# Patient Record
Sex: Female | Born: 1985 | Race: Black or African American | Hispanic: No | Marital: Single | Smoking: Never smoker
Health system: Southern US, Community
[De-identification: ages and names within clinical notes are randomized; demographics above are authoritative.]

## PROBLEM LIST (undated history)

## (undated) ENCOUNTER — Inpatient Hospital Stay (HOSPITAL_COMMUNITY): Payer: Self-pay

## (undated) DIAGNOSIS — K469 Unspecified abdominal hernia without obstruction or gangrene: Secondary | ICD-10-CM

## (undated) DIAGNOSIS — O139 Gestational [pregnancy-induced] hypertension without significant proteinuria, unspecified trimester: Secondary | ICD-10-CM

## (undated) DIAGNOSIS — F419 Anxiety disorder, unspecified: Secondary | ICD-10-CM

## (undated) HISTORY — PX: NO PAST SURGERIES: SHX2092

---
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
## 2006-01-30 ENCOUNTER — Inpatient Hospital Stay (HOSPITAL_COMMUNITY): Admission: AD | Admit: 2006-01-30 | Discharge: 2006-01-30 | Payer: Self-pay | Admitting: Obstetrics and Gynecology

## 2006-02-12 ENCOUNTER — Ambulatory Visit (HOSPITAL_COMMUNITY): Admission: RE | Admit: 2006-02-12 | Discharge: 2006-02-12 | Payer: Self-pay | Admitting: Obstetrics

## 2006-04-06 ENCOUNTER — Inpatient Hospital Stay (HOSPITAL_COMMUNITY): Admission: AD | Admit: 2006-04-06 | Discharge: 2006-04-06 | Payer: Self-pay | Admitting: Obstetrics

## 2006-04-08 ENCOUNTER — Ambulatory Visit (HOSPITAL_COMMUNITY): Admission: RE | Admit: 2006-04-08 | Discharge: 2006-04-08 | Payer: Self-pay | Admitting: Obstetrics

## 2006-04-18 ENCOUNTER — Inpatient Hospital Stay (HOSPITAL_COMMUNITY): Admission: AD | Admit: 2006-04-18 | Discharge: 2006-04-18 | Payer: Self-pay | Admitting: Obstetrics

## 2006-04-30 ENCOUNTER — Ambulatory Visit (HOSPITAL_COMMUNITY): Admission: RE | Admit: 2006-04-30 | Discharge: 2006-04-30 | Payer: Self-pay | Admitting: Obstetrics

## 2006-05-20 ENCOUNTER — Inpatient Hospital Stay (HOSPITAL_COMMUNITY): Admission: AD | Admit: 2006-05-20 | Discharge: 2006-05-20 | Payer: Self-pay | Admitting: Obstetrics & Gynecology

## 2006-06-18 ENCOUNTER — Inpatient Hospital Stay (HOSPITAL_COMMUNITY): Admission: AD | Admit: 2006-06-18 | Discharge: 2006-06-18 | Payer: Self-pay | Admitting: Obstetrics

## 2006-06-30 ENCOUNTER — Inpatient Hospital Stay (HOSPITAL_COMMUNITY): Admission: AD | Admit: 2006-06-30 | Discharge: 2006-07-02 | Payer: Self-pay | Admitting: Obstetrics

## 2006-07-05 ENCOUNTER — Inpatient Hospital Stay (HOSPITAL_COMMUNITY): Admission: AD | Admit: 2006-07-05 | Discharge: 2006-07-05 | Payer: Self-pay | Admitting: Obstetrics

## 2006-07-13 ENCOUNTER — Inpatient Hospital Stay (HOSPITAL_COMMUNITY): Admission: AD | Admit: 2006-07-13 | Discharge: 2006-07-17 | Payer: Self-pay | Admitting: Obstetrics & Gynecology

## 2006-12-31 ENCOUNTER — Emergency Department (HOSPITAL_COMMUNITY): Admission: EM | Admit: 2006-12-31 | Discharge: 2007-01-01 | Payer: Self-pay | Admitting: Emergency Medicine

## 2007-06-23 ENCOUNTER — Telehealth (INDEPENDENT_AMBULATORY_CARE_PROVIDER_SITE_OTHER): Payer: Self-pay | Admitting: *Deleted

## 2007-06-26 DIAGNOSIS — L708 Other acne: Secondary | ICD-10-CM | POA: Insufficient documentation

## 2007-06-29 ENCOUNTER — Ambulatory Visit: Payer: Self-pay | Admitting: Pulmonary Disease

## 2007-06-29 DIAGNOSIS — G43909 Migraine, unspecified, not intractable, without status migrainosus: Secondary | ICD-10-CM | POA: Insufficient documentation

## 2007-06-29 DIAGNOSIS — I10 Essential (primary) hypertension: Secondary | ICD-10-CM

## 2007-06-29 DIAGNOSIS — N390 Urinary tract infection, site not specified: Secondary | ICD-10-CM

## 2007-07-01 LAB — CONVERTED CEMR LAB
ALT: 19 units/L (ref 0–35)
Albumin: 3.8 g/dL (ref 3.5–5.2)
Alkaline Phosphatase: 83 units/L (ref 39–117)
BUN: 6 mg/dL (ref 6–23)
Basophils Absolute: 0 10*3/uL (ref 0.0–0.1)
Basophils Relative: 0.4 % (ref 0.0–1.0)
CO2: 28 meq/L (ref 19–32)
Calcium: 9.5 mg/dL (ref 8.4–10.5)
Chloride: 106 meq/L (ref 96–112)
Creatinine, Ser: 0.5 mg/dL (ref 0.4–1.2)
MCHC: 34.3 g/dL (ref 30.0–36.0)
Monocytes Absolute: 0.3 10*3/uL (ref 0.2–0.7)
Monocytes Relative: 3.4 % (ref 3.0–11.0)
Platelets: 270 10*3/uL (ref 150–400)
Potassium: 4 meq/L (ref 3.5–5.1)
RBC: 4.89 M/uL (ref 3.87–5.11)
RDW: 12.9 % (ref 11.5–14.6)
Total Bilirubin: 0.5 mg/dL (ref 0.3–1.2)
Total Protein: 7 g/dL (ref 6.0–8.3)

## 2007-11-27 ENCOUNTER — Ambulatory Visit: Payer: Self-pay | Admitting: Internal Medicine

## 2008-03-09 ENCOUNTER — Emergency Department (HOSPITAL_COMMUNITY): Admission: EM | Admit: 2008-03-09 | Discharge: 2008-03-09 | Payer: Self-pay | Admitting: Emergency Medicine

## 2008-03-23 ENCOUNTER — Ambulatory Visit: Payer: Self-pay | Admitting: Pulmonary Disease

## 2008-03-23 DIAGNOSIS — R519 Headache, unspecified: Secondary | ICD-10-CM | POA: Insufficient documentation

## 2008-03-23 DIAGNOSIS — R51 Headache: Secondary | ICD-10-CM

## 2008-03-28 DIAGNOSIS — R109 Unspecified abdominal pain: Secondary | ICD-10-CM | POA: Insufficient documentation

## 2008-04-15 IMAGING — US US OB FOLLOW-UP
1 series · 13 of 28 positions shown · non-contrast
Comparison: none

CLINICAL DATA: Evaluate growth; size greater than dates; EGA by LMP is 26 weeks 1 day.

[Series 1: us ob follow-up · 0.37mm/px · 13 of 64 slices shown]
[im 3/64]
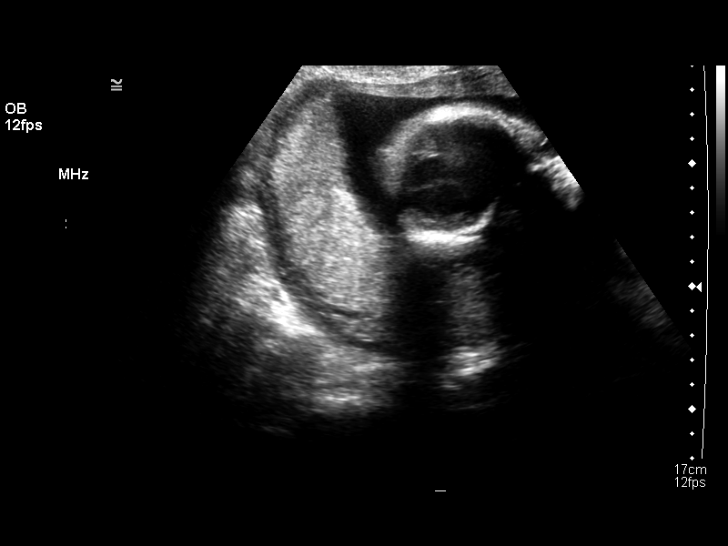
[im 8/64]
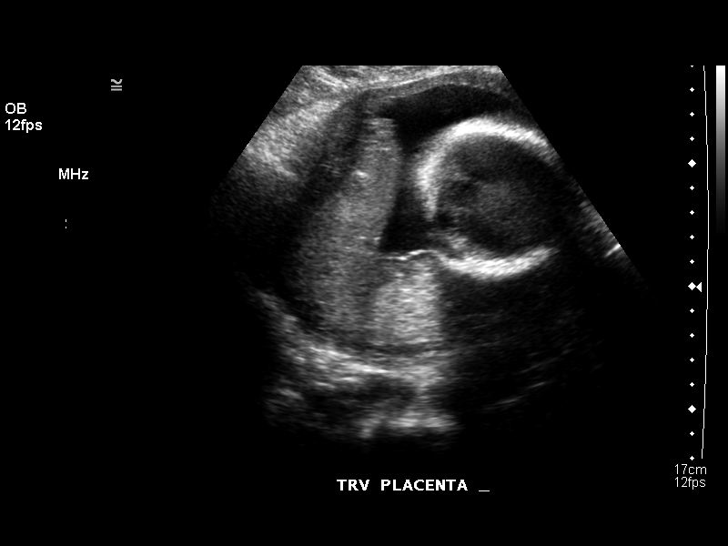
[im 12/64]
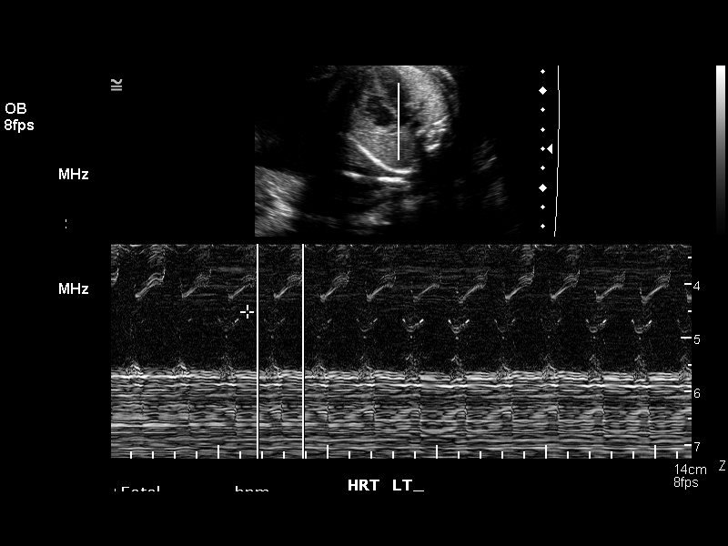
[im 17/64]
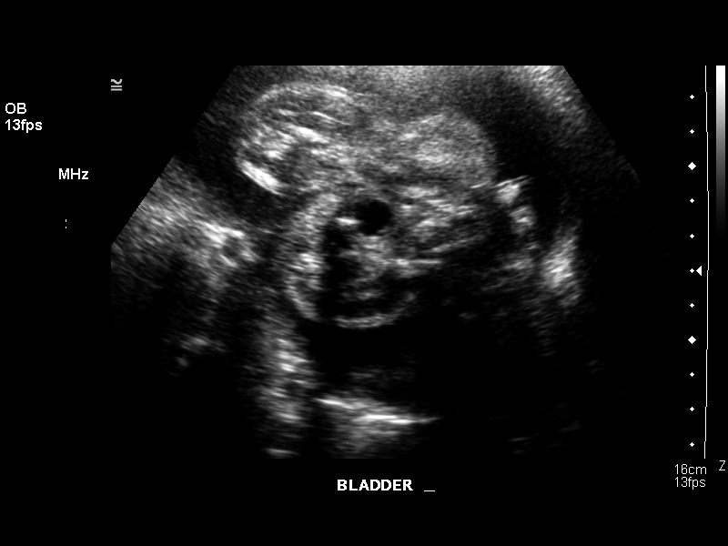
[im 22/64]
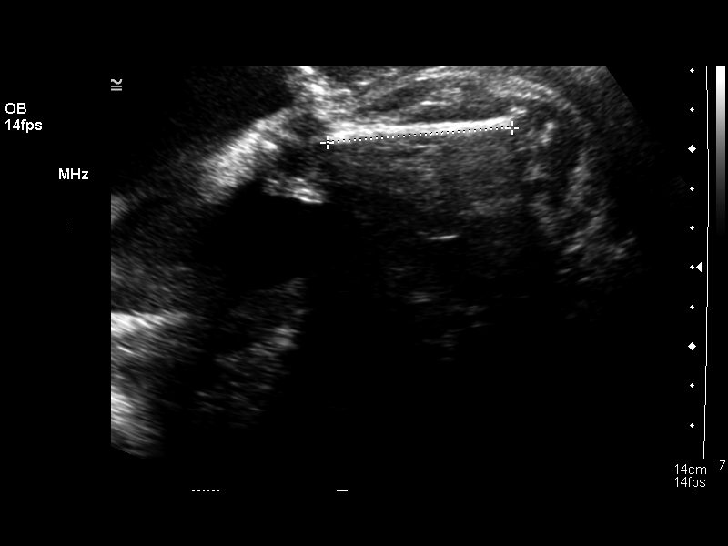
[im 26/64]
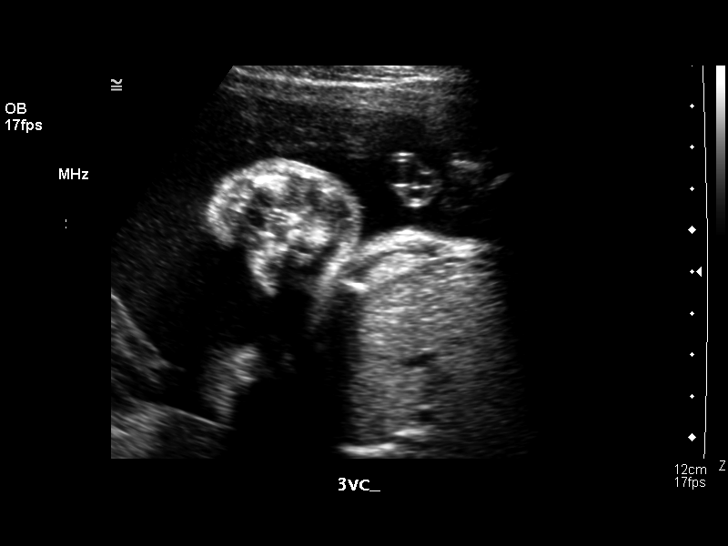
[im 33/64]
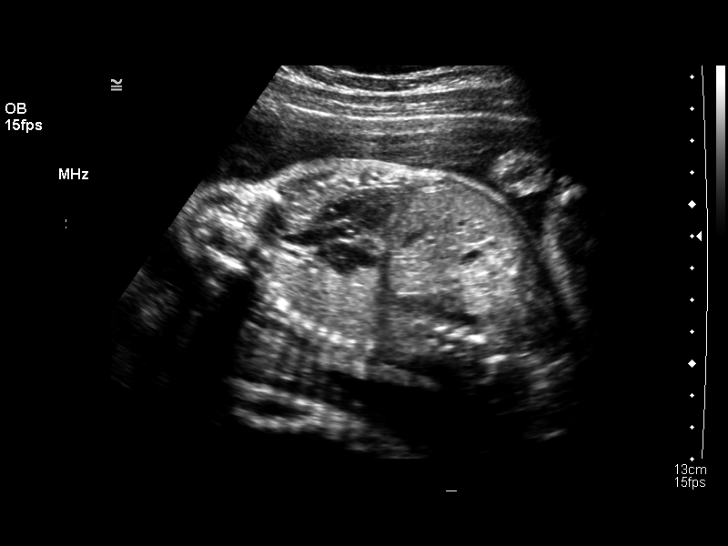
[im 38/64]
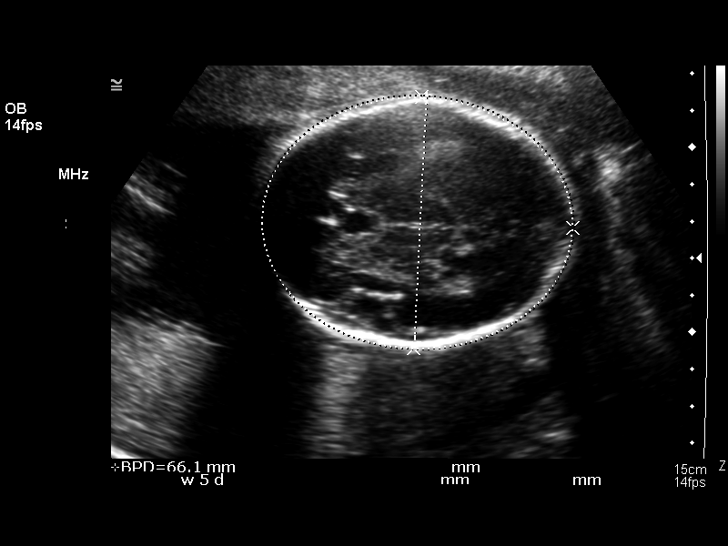
[im 43/64]
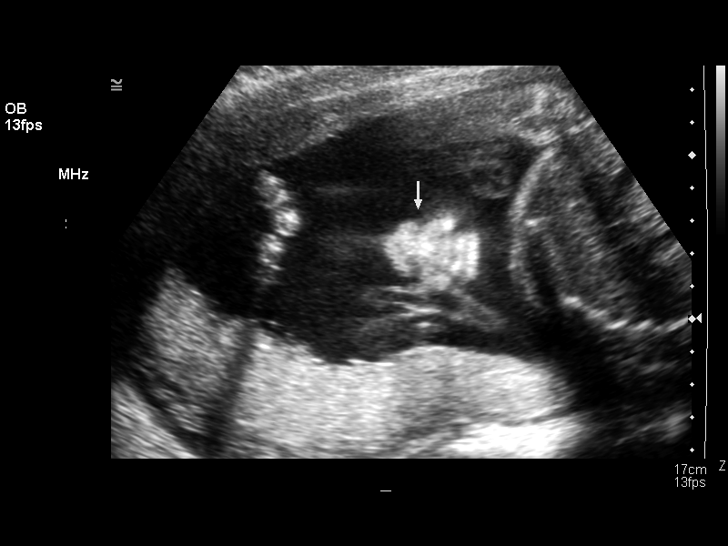
[im 47/64]
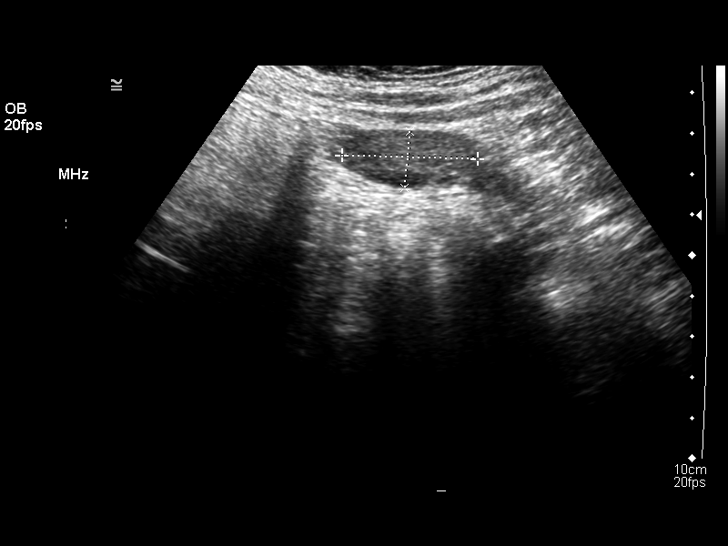
[im 52/64]
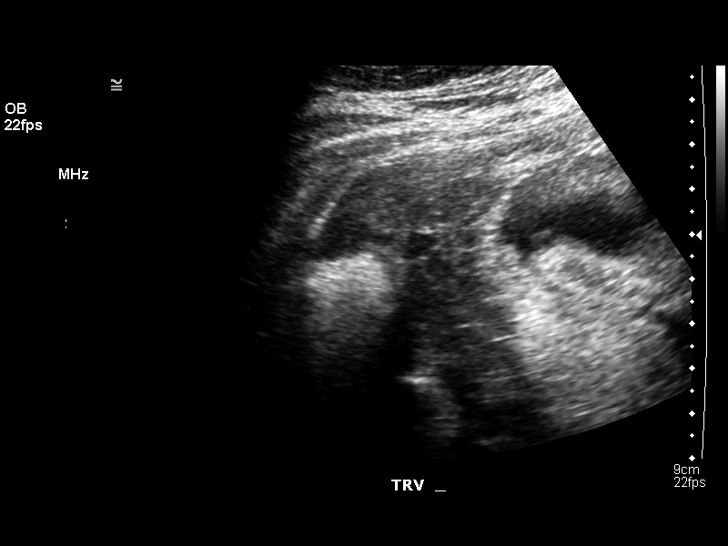
[im 57/64]
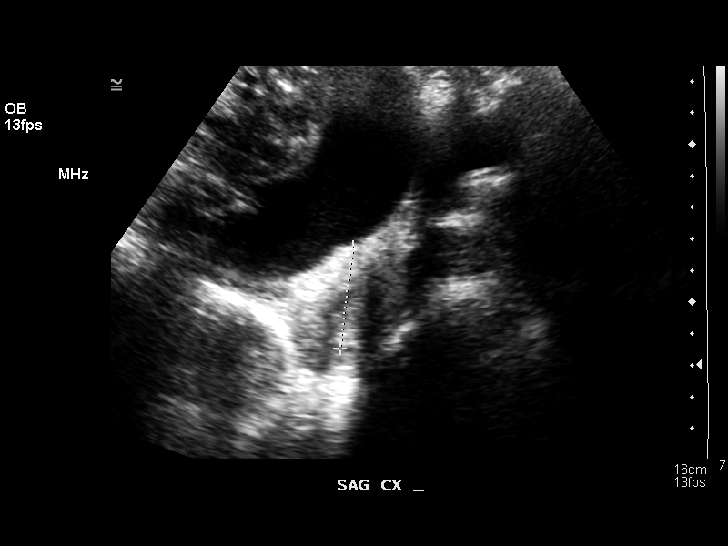
[im 61/64]
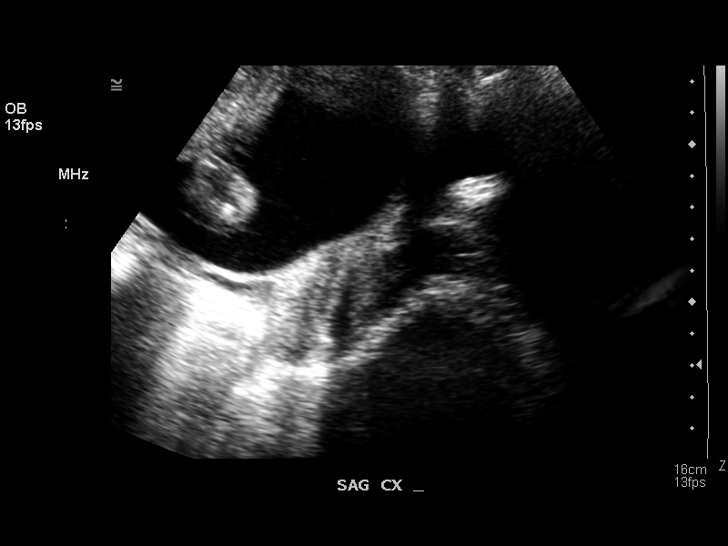

[13 of 28 positions shown; findings below may reference images not displayed]

OBSTETRICAL ULTRASOUND RE-EVALUATION:
 Number of Fetuses:  1
 Heart Rate:  144 bpm
 Movement:  Yes
 Breathing:  No
 Presentation:  Breech
 Placental Location:  Fundal, posterior
 Grade:  I
 Previa:  No
 Amniotic Fluid (subjective):  Normal
 Amniotic Fluid (objective):  6.0 cm vertical pocket 

 FETAL BIOMETRY
 BPD:  6.6 cm     26 w 5 d 
 HC:  24.3 cm   26 w 3 d 
 AC:  23.3 cm  27 w 4 d 
 FL:  4.8 cm   26 w 1 d 

 Mean GA:  26 w 5 d   US EDC:  07/10/06
 Assigned GA:  26 w 1 d   Assigned EDC:  07/14/06

 EFW:  8225 grams 75th ? 90th %ile (885 ? 0506 g) for 26 weeks 

 FETAL ANATOMY
 Lateral Ventricles:  Visualized 
 Thalami/CSP:  Visualized 
 Posterior Fossa:  Visualized 
 Nuchal Region:  Visualized 
 Spine:  Previously visualized 
 4 Chamber Heart on Left:  Visualized 
 Stomach on Left:  Visualized 
 3 Vessel Cord:  Visualized 
 Cord Insertion site:  Visualized 
 Kidneys:  Visualized 
 Bladder:  Visualized 
 Extremities:  Previously visualized 

 ADDITIONAL ANATOMY VISUALIZED:  Upper lip, profile, diaphragm, aortic arch.

 MATERNAL UTERINE AND ADNEXAL FINDINGS
 Cervix:  3.2 cm transabdominal.
IMPRESSION: There is a single living intrauterine gestation in breech presentation.  The mean gestational age by today?s ultrasound is 26 weeks 5 days, which is at the 30th-35th percentile for a 26 week gestation.  The fetal indices are concordant and the visualized anatomy is normal.  The amniotic fluid volume is within normal limits.

## 2008-06-14 ENCOUNTER — Emergency Department (HOSPITAL_COMMUNITY): Admission: EM | Admit: 2008-06-14 | Discharge: 2008-06-14 | Payer: Self-pay | Admitting: Emergency Medicine

## 2008-08-18 ENCOUNTER — Telehealth: Payer: Self-pay | Admitting: Adult Health

## 2008-12-28 ENCOUNTER — Emergency Department (HOSPITAL_COMMUNITY): Admission: EM | Admit: 2008-12-28 | Discharge: 2008-12-28 | Payer: Self-pay | Admitting: Emergency Medicine

## 2009-03-06 ENCOUNTER — Encounter: Payer: Self-pay | Admitting: Adult Health

## 2009-03-06 ENCOUNTER — Ambulatory Visit: Payer: Self-pay | Admitting: Internal Medicine

## 2009-03-06 DIAGNOSIS — M25569 Pain in unspecified knee: Secondary | ICD-10-CM | POA: Insufficient documentation

## 2009-03-06 DIAGNOSIS — R5381 Other malaise: Secondary | ICD-10-CM | POA: Insufficient documentation

## 2009-03-06 DIAGNOSIS — R5383 Other fatigue: Secondary | ICD-10-CM

## 2009-03-06 DIAGNOSIS — R635 Abnormal weight gain: Secondary | ICD-10-CM | POA: Insufficient documentation

## 2009-03-06 DIAGNOSIS — K5900 Constipation, unspecified: Secondary | ICD-10-CM | POA: Insufficient documentation

## 2009-03-07 ENCOUNTER — Telehealth: Payer: Self-pay | Admitting: Adult Health

## 2009-03-07 LAB — CONVERTED CEMR LAB
Albumin: 4 g/dL (ref 3.5–5.2)
BUN: 8 mg/dL (ref 6–23)
Basophils Absolute: 0 10*3/uL (ref 0.0–0.1)
CO2: 29 meq/L (ref 19–32)
Calcium: 9.5 mg/dL (ref 8.4–10.5)
Eosinophils Absolute: 0.5 10*3/uL (ref 0.0–0.7)
Glucose, Bld: 88 mg/dL (ref 70–99)
HCT: 42.7 % (ref 36.0–46.0)
Hemoglobin: 14.2 g/dL (ref 12.0–15.0)
Lymphs Abs: 2 10*3/uL (ref 0.7–4.0)
MCHC: 33.4 g/dL (ref 30.0–36.0)
Neutro Abs: 3.6 10*3/uL (ref 1.4–7.7)
Platelets: 206 10*3/uL (ref 150.0–400.0)
Potassium: 4 meq/L (ref 3.5–5.1)
RDW: 12.3 % (ref 11.5–14.6)
Rhuematoid fact SerPl-aCnc: 20 intl units/mL (ref 0.0–20.0)
Sed Rate: 5 mm/hr (ref 0–22)
Sodium: 143 meq/L (ref 135–145)
TSH: 0.36 microintl units/mL (ref 0.35–5.50)
Total Protein: 7.2 g/dL (ref 6.0–8.3)
hCG, Beta Chain, Quant, S: <0.5 milliintl units/mL

## 2009-06-27 ENCOUNTER — Emergency Department (HOSPITAL_COMMUNITY): Admission: EM | Admit: 2009-06-27 | Discharge: 2009-06-27 | Payer: Self-pay | Admitting: Emergency Medicine

## 2010-02-14 ENCOUNTER — Emergency Department (HOSPITAL_COMMUNITY): Admission: EM | Admit: 2010-02-14 | Discharge: 2010-02-14 | Payer: Self-pay | Admitting: Emergency Medicine

## 2010-02-15 ENCOUNTER — Encounter: Payer: Self-pay | Admitting: Adult Health

## 2010-02-15 ENCOUNTER — Ambulatory Visit: Payer: Self-pay | Admitting: Pulmonary Disease

## 2010-05-04 ENCOUNTER — Telehealth: Payer: Self-pay | Admitting: Pulmonary Disease

## 2010-07-06 ENCOUNTER — Ambulatory Visit: Admit: 2010-07-06 | Payer: Self-pay | Admitting: Pulmonary Disease

## 2010-07-09 ENCOUNTER — Telehealth: Payer: Self-pay | Admitting: Pulmonary Disease

## 2010-07-16 ENCOUNTER — Encounter: Payer: Self-pay | Admitting: Obstetrics

## 2010-07-24 NOTE — Progress Notes (Signed)
Summary: nos appt  Phone Note Call from Patient   Caller: [email or web address]  Call For: nadel Summary of Call: Rsc nos from 11/10 to 07/06/2010. Initial call taken by: Darletta Moll,  May 04, 2010 9:36 AM

## 2010-07-24 NOTE — Assessment & Plan Note (Signed)
Summary: Acute NP office visit - lower abd pain, constipation   CC:  lower abd pain, described as sharp from the pelvic area up to the upper abd.  nausea w/ eating, constipation, and bloating x47month..  History of Present Illness: 25  yo AAF with known history of intermittent headaches, borderline HTN.    11/27/07--Pt present today reporting over last year she has had several occasions when her  blood pressure has been elevated. 140-170 systolic. During recent pregnancy 1 year ago she had borderline HTN. She does use advil as needed for headache.   Checked blood pressure at [facility]-was elevated last week. Pt is a single mom going to school full-time and working second shift fulltime. She has 24 yr old. Very stressed, puts lots of demands on self. "feels overwhelmed"   March 06, 2009--Presents for multiple complaints today. Complains over last several weeks, legs and feet ache at night, she has to massage muscle and feet to help. Takes tylneol with no help. Currently single mom of 2 yr old son, works full time, and goes to school. Complains of frequent headaches and neck tightness, works on computer all day. Takes goody powders w/ some help. Complains of constipation, eats fruit and takes laxatavie, no bloody stool, abd pain. Complains that her weight keeps going up, despite trying to eat right. Discussed diet, she does eat late at night, drinks fruit juice, eats fast food at least 1x wk, only exercsies some when she has a chance. She has Mirena IUD. Does not think she is pregnant. Denies chest pain, dyspnea, orthopnea, hemoptysis, fever, n/v/d, edema, headache,recent travel , known injury, back pain, radicular symptoms, ext. weakness, joint swelling, rash, cough.    02/15/10--Presents for an acute office visit. Complains of lower abd pain, described as sharp from the pelvic area up to the upper abd.  nausea w/ eating, constipation, bloating x73month. She complains that she is very constipated, has  intermittent hard stools, Started laxatives this week w/ no substantial results. She presented with constipation in past w/ recs for stool softner, miralax, diet, etc. She says she tried for a while but stopped. No bloody stools, vomitting, dysphagia. Has a Mirena. Denies chest pain,  orthopnea, hemoptysis, fever,  edema, headache.  Medications Prior to Update: 1)  Mirena 20 Mcg/24hr  Iud (Levonorgestrel) .... Every 5 Years 2)  Miralax  Powd (Polyethylene Glycol 3350) .... Daily 3)  Colace 100 Mg Caps (Docusate Sodium) .Marland Kitchen.. 1 By Mouth At Bedtime 4)  Midrin 325-65-100 Mg  Caps (Apap-Isometheptene-Dichloral) .Marland Kitchen.. 1 By Mouth Every 4 Hr As Needed Headache  Current Medications (verified): 1)  Mirena 20 Mcg/24hr  Iud (Levonorgestrel) .... Every 5 Years 2)  Miralax  Powd (Polyethylene Glycol 3350) .... Daily 3)  Colace 100 Mg Caps (Docusate Sodium) .Marland Kitchen.. 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   Risk Factors: Smoking Status: quit (06/29/2007)  Review of Systems      See HPI  Vital Signs:  Patient profile:   25 year old female Height:      63 inches Weight:      171.13 pounds BMI:  30.42 O2 Sat:      99 % on Room air Temp:     97.8 degrees F oral Pulse rate:   88 / minute BP sitting:   136 / 88  (right arm) Cuff size:   regular  Vitals Entered By: Boone Master CNA/MA (February 15, 2010 9:40 AM)  O2 Flow:  Room air CC: lower abd pain, described as sharp from the pelvic area up to the upper abd.  nausea w/ eating, constipation, bloating  x17month. Is Patient Diabetic? No Comments Medications reviewed with patient Daytime contact number verified with patient. Boone Master CNA/MA  February 15, 2010 9:41 AM    Physical Exam  Additional Exam:  GENERAL:  A/Ox3; pleasant & cooperative.NAD HEENT:  [place]/AT, EOM-wnl, PERRLA, EACs-clear, TMs-wnl, NOSE-clear, THROAT-clear & wnl. NECK:  Supple w/ fair ROM; no JVD; normal carotid impulses w/o bruits; no thyromegaly or nodules palpated; no lymphadenopathy. CHEST:  Clear to P & A; w/o, wheezes/ rales/ or rhonchi. HEART:  RRR, no m/r/g  heard ABDOMEN:  Soft & nt; nml bowel sounds; no organomegaly or masses detected, no gurading or rebound.  EXT: Warm bilat,  no calf pain, edema, clubbing, pulses intact,  no edema, warm, intact pulses bilaterally, joint w/o swelling, nml rom,  Skin: no rash/lesion    Impression & Recommendations:  Problem # 1:  CONSTIPATION (ICD-564.00)  Suspect symptoms are possible IBS /constipation  Plan:  Increase water intake.  May use Miralax at bedtime , if still constipated increase to two times a day  GAS X WITH ALL MEALS  High fiber diet. --lots of fruits vegetables, prunes, yogurts-Activa May try Align once daily --probiotic x 2 weeks, if unable to afford may use generic acidophllius once daily  2 Stool softners  at bedtime  If not improving, call back for sooner follow up  Please contact office for sooner follow up if symptoms do not improve or worsen  follow up Dr. Kriste Basque in 3 mo ths for physical if not improving will need further evaluation and tx options.  Her updated medication list for this problem includes:    Miralax Powd (Polyethylene glycol 3350) .Marland Kitchen... Daily    Colace 100 Mg Caps (Docusate sodium) .Marland Kitchen... 1 by mouth at bedtime  Orders: Est. Patient Level III (16109)  Complete Medication List: 1)  Mirena 20 Mcg/24hr Iud (Levonorgestrel) .... Every 5 years 2)  Miralax Powd (Polyethylene glycol 3350) .... Daily 3)  Colace 100 Mg Caps (Docusate  sodium) .Marland Kitchen.. 1 by mouth at bedtime   Patient Instructions: 1)  Increase water intake.  2)  May use Miralax at bedtime , if still constipated increase to two times a day  3)  GAS X WITH ALL MEALS  4)  High fiber diet. --lots of fruits vegetables, prunes, yogurts-Activa 5)  May try Align once daily --probiotic x 2 weeks, if unable to afford may use generic acidophllius once daily  6)  2 Stool softners  at bedtime  7)  If not improving, call back for sooner follow up  8)  Please contact office for sooner follow up if symptoms do not improve or worsen  9)  follow up Dr. Kriste Basque in 3 mo ths for physical   Immunization History:  Influenza Immunization History:    Influenza:  historical (04/24/2009)

## 2010-07-24 NOTE — Letter (Signed)
Summary: [employer]  [address]   [place], [place] [address]   Phone: [phone]  Fax: [phone]    Today's Date: February 15, 2010  Name of Patient: [name]  The above named patient had a medical visit today at:  9:30am.  Please take this into consideration when reviewing the time away from work.  Special Instructions:  [  ] None  [ X ] To be off the remainder of today, returning to the normal work schedule tomorrow.  [  ] To be off until the next scheduled appointment on ______________________.  [  ] Other ________________________________________________________________ ________________________________________________________________________   Sincerely yours,       Rubye Oaks, NP

## 2010-07-26 ENCOUNTER — Inpatient Hospital Stay (HOSPITAL_COMMUNITY)
Admission: AD | Admit: 2010-07-26 | Discharge: 2010-07-26 | Disposition: A | Discharge status: Home / Self Care | Payer: Self-pay | Source: Ambulatory Visit | Attending: Obstetrics | Admitting: Obstetrics

## 2010-07-26 ENCOUNTER — Inpatient Hospital Stay (HOSPITAL_COMMUNITY): Payer: Self-pay

## 2010-07-26 DIAGNOSIS — N949 Unspecified condition associated with female genital organs and menstrual cycle: Secondary | ICD-10-CM

## 2010-07-26 DIAGNOSIS — N938 Other specified abnormal uterine and vaginal bleeding: Secondary | ICD-10-CM | POA: Insufficient documentation

## 2010-07-26 LAB — HEMOGLOBIN AND HEMATOCRIT, BLOOD
HCT: 38.5 % (ref 36.0–46.0)
Hemoglobin: 13.3 g/dL (ref 12.0–15.0)

## 2010-07-26 LAB — URINE MICROSCOPIC-ADD ON

## 2010-07-26 LAB — URINALYSIS, ROUTINE W REFLEX MICROSCOPIC
Leukocytes, UA: NEGATIVE
Nitrite: NEGATIVE
Protein, ur: NEGATIVE mg/dL
Urobilinogen, UA: 0.2 mg/dL (ref 0.0–1.0)

## 2010-07-26 LAB — WET PREP, GENITAL

## 2010-07-26 NOTE — Progress Notes (Signed)
Summary: nos appt  Phone Note Call from Patient   Caller: [email or web address]  Call For: Taralynn Quiett Summary of Call: Rsc nos from 1/13 to 2/29. Initial call taken by: Darletta Moll,  July 09, 2010 9:25 AM

## 2010-07-27 LAB — GC/CHLAMYDIA PROBE AMP, GENITAL: Chlamydia, DNA Probe: NEGATIVE

## 2010-08-02 ENCOUNTER — Ambulatory Visit: Payer: Self-pay | Admitting: Adult Health

## 2010-08-02 ENCOUNTER — Emergency Department (HOSPITAL_COMMUNITY): Payer: Self-pay

## 2010-08-02 ENCOUNTER — Emergency Department (HOSPITAL_COMMUNITY)
Admission: EM | Admit: 2010-08-02 | Discharge: 2010-08-02 | Disposition: A | Discharge status: Home / Self Care | Payer: Self-pay | Attending: Emergency Medicine | Admitting: Emergency Medicine

## 2010-08-02 ENCOUNTER — Telehealth: Payer: Self-pay | Admitting: Pulmonary Disease

## 2010-08-02 DIAGNOSIS — IMO0001 Reserved for inherently not codable concepts without codable children: Secondary | ICD-10-CM | POA: Insufficient documentation

## 2010-08-02 DIAGNOSIS — N390 Urinary tract infection, site not specified: Secondary | ICD-10-CM | POA: Insufficient documentation

## 2010-08-02 LAB — URINALYSIS, ROUTINE W REFLEX MICROSCOPIC
Ketones, ur: 40 mg/dL — AB
Nitrite: NEGATIVE
Protein, ur: 30 mg/dL — AB
pH: 5.5 (ref 5.0–8.0)

## 2010-08-02 LAB — URINE MICROSCOPIC-ADD ON

## 2010-08-04 LAB — URINE CULTURE

## 2010-08-09 NOTE — Progress Notes (Signed)
Summary: flu sx  Phone Note Call from Patient   Caller: Patient Call For: [name] Summary of Call: pt c/o body aches/ pain w/ nausea x 1 day. has fever but doesn't know temp[facility] [address] (pt last seen in 09). pt # [id number] Initial call taken by: Tivis Ringer, CNA,  August 02, 2010 2:50 PM  Follow-up for Phone Call        Spoke with the pt and she is c/o body aches, chills, headaches, nausea x 1 day.  offerted pt and appt tomorrow but she states she cannot get off work and that she will just go to urgent care tonight. Carron Curie CMA  August 02, 2010 3:44 PM

## 2010-08-10 ENCOUNTER — Inpatient Hospital Stay (HOSPITAL_COMMUNITY)
Admission: AD | Admit: 2010-08-10 | Discharge: 2010-08-10 | Discharge status: Against Medical Advice | Payer: Self-pay | Source: Ambulatory Visit | Attending: Obstetrics | Admitting: Obstetrics

## 2010-08-10 DIAGNOSIS — N949 Unspecified condition associated with female genital organs and menstrual cycle: Secondary | ICD-10-CM | POA: Insufficient documentation

## 2010-08-10 DIAGNOSIS — R109 Unspecified abdominal pain: Secondary | ICD-10-CM | POA: Insufficient documentation

## 2010-08-10 DIAGNOSIS — K5900 Constipation, unspecified: Secondary | ICD-10-CM | POA: Insufficient documentation

## 2010-08-22 ENCOUNTER — Encounter: Payer: Self-pay | Admitting: Pulmonary Disease

## 2010-08-23 ENCOUNTER — Telehealth: Payer: Self-pay | Admitting: Pulmonary Disease

## 2010-08-30 NOTE — Progress Notes (Signed)
Summary: nos appt  Phone Note Call from Patient   Caller: [email or web address]  Call For: nadel Summary of Call: LMTCB x2 to rsc nos from 32/29. Initial call taken by: Darletta Moll,  August 23, 2010 3:11 PM

## 2010-09-06 LAB — URINALYSIS, ROUTINE W REFLEX MICROSCOPIC
Hgb urine dipstick: NEGATIVE
Nitrite: NEGATIVE
Specific Gravity, Urine: 1.012 (ref 1.005–1.030)
Urobilinogen, UA: 0.2 mg/dL (ref 0.0–1.0)

## 2010-09-06 LAB — POCT PREGNANCY, URINE: Preg Test, Ur: NEGATIVE

## 2010-09-30 LAB — CBC
Platelets: 183 10*3/uL (ref 150–400)
WBC: 11.4 10*3/uL — ABNORMAL HIGH (ref 4.0–10.5)

## 2010-09-30 LAB — COMPREHENSIVE METABOLIC PANEL
AST: 19 U/L (ref 0–37)
Albumin: 3.9 g/dL (ref 3.5–5.2)
Alkaline Phosphatase: 83 U/L (ref 39–117)
Chloride: 107 mEq/L (ref 96–112)
GFR calc Af Amer: >60 mL/min (ref >60)
Potassium: 3.7 mEq/L (ref 3.5–5.1)
Total Bilirubin: 0.3 mg/dL (ref 0.3–1.2)

## 2010-09-30 LAB — DIFFERENTIAL
Basophils Absolute: 0.1 10*3/uL (ref 0.0–0.1)
Basophils Relative: 1 % (ref 0–1)
Eosinophils Relative: 6 % — ABNORMAL HIGH (ref 0–5)
Lymphocytes Relative: 28 % (ref 12–46)
Monocytes Absolute: 0.2 10*3/uL (ref 0.1–1.0)
Monocytes Relative: 2 % — ABNORMAL LOW (ref 3–12)

## 2010-09-30 LAB — URINALYSIS, ROUTINE W REFLEX MICROSCOPIC
Glucose, UA: NEGATIVE mg/dL
Hgb urine dipstick: NEGATIVE
Protein, ur: NEGATIVE mg/dL
pH: 6 (ref 5.0–8.0)

## 2010-11-09 NOTE — H&P (Signed)
NAMECLYDE, [name]               ACCOUNT NO.:  [id number]   MEDICAL RECORD NO.:  [id number]          PATIENT TYPE:  INP   LOCATION:  9169                          FACILITY:  [facility]   PHYSICIAN:  Roseanna Rainbow, M.D.DATE OF BIRTH:  06-12-1986   DATE OF ADMISSION:  07/13/2006  DATE OF DISCHARGE:                              HISTORY & PHYSICAL   CHIEF COMPLAINT:  The patient is a 25 year old gravida 1, para 0 with an  estimated date of confinement as July 14, 2006 with an intrauterine  pregnancy at 39+ weeks with elevated blood pressures.   HISTORY OF PRESENT ILLNESS:  Please see the above.  Upon review of her  blood pressures during her prenatal course, the range of blood pressures  have been in the 130/80s to 140/90 range.  An initial blood pressure at  9 weeks was 132/69 and at 10 weeks it was 147/70.  The patient denies  any neurological symptoms.  She does report some lower extremity  swelling.   ALLERGIES:  No known drug allergies.   OB RISK FACTORS:  Please see the above and sickle cell trait.   MEDICATIONS:  Please see the medication reconciliation form.   PRENATAL LABS:  Chlamydia probe negative.  Urine culture and sensitivity  no growth.  A 1-hour GCT 102.  GC/DNA probe negative.  Hepatitis B  surface antigen negative.  Pap smear negative.  Hematocrit 34.7,  hemoglobin 11.9, HIV nonreactive.  Platelets 265,000.  Blood type AB  positive, antibody screen negative, RPR nonreactive.  Rubella immune.  Sickle cell trait positive.  TSH normal; and GBS negative on December  28.   PAST GYN HISTORY:  History of Chlamydia.   PAST MEDICAL HISTORY:  She denies.   PAST SURGICAL HISTORY:  No previous surgery.   SOCIAL HISTORY:  She works in Clinical biochemist at [employer].  She is  single.  She does not give any significant history of alcohol usage, has  no significant smoking history of.  Denies illicit drug use.   FAMILY HISTORY:  Asthma.   PHYSICAL EXAMINATION:  VITAL  SIGNS:  Temperature 98.3, pulse 101,  respirations 18, blood pressure 154/82.  Fetal heart tracing reassuring.  STERILE VAGINAL EXAM:  Per the RN.  The cervix is distended a fingertip.   Urinalysis Negative protein, PIH panel normal, uric acid 4.8.   ASSESSMENT:  Prima gravida with an intrauterine pregnancy at term,  questionable chronic blood pressure elevations doubt superimposed  pregnancy-induced hypertension.  Fetal heart tracing consistent with  fetal well bearing.  Unfavorable Bishop score.   PLAN:  Admission, 2-stage induction of labor.      Roseanna Rainbow, M.D.  Electronically Signed     LAJ/MEDQ  D:  07/14/2006  T:  07/14/2006  Job:  [id number]

## 2010-11-09 NOTE — Discharge Summary (Signed)
NAMEELIANNE, [name]               ACCOUNT NO.:  [id number]   MEDICAL RECORD NO.:  [id number]          PATIENT TYPE:  INP   LOCATION:  9156                          [facility]ILITY:  [facility]   PHYSICIAN:  Charles A. Clearance Coots, M.D.DATE OF BIRTH:  [birth year]   DATE OF ADMISSION:  06/30/2006  DATE OF DISCHARGE:  07/02/2006                               DISCHARGE SUMMARY   ADMITTING DIAGNOSIS:  1. Thirty-seven weeks' gestation.  2. Increased blood pressure.   DISCHARGE DIAGNOSIS:  1. Thirty-seven weeks' gestation.  2. Increased blood pressure.   Status post bed rest, discharged home undelivered at approximately 37-  weeks gestation in good condition, much improved.   REASON FOR ADMISSION:  A 25 year old G1, estimated date of confinement  of July 14, 2006, presented to the office with increased blood  pressure, dizziness, visual changes and headache.  The patient was sent  to the [facility] for bedrest and blood pressure  management.   PAST MEDICAL HISTORY:  Surgery none.  Illnesses none.   MEDICATIONS:  Prenatal vitamins.   ALLERGIES:  No known drug allergies.   SOCIAL HISTORY:  Single.  Negative tobacco, alcohol or recreational drug  use.   PHYSICAL EXAMINATION:  She is afebrile.  Blood pressure 133/74 on  admission.  LUNGS:  Were clear to auscultation.  HEART:  Regular rate and rhythm.  ABDOMEN:  Gravid, nontender.  CERVIX:  Omitted.  External fetal monitor was reactive with occasional  uterine contractions.   IMPRESSION:  Thirty-seven weeks' gestation, increased blood pressure.  Rule out preeclampsia.   PLAN:  Bedrest, start 24-hour urine for a total protein and creatinine  clearance.   HOSPITAL COURSE:  The patient was admitted and immediately responded  well to bedrest with blood pressures dropping from the 140 to 150 over  90s range to 130s over 70s.  A 24-hour urine was started, and results of  a 24-hour urine was within normal limits with no protein  collected and  creatinine clearance of 128 mL per minute.  Blood pressures continued to  be completely within normal limits, on 130s over 70s after 48 hours, and  the patient was therefore discharged home on bedrest.   DISCHARGE DISPOSITION:  MEDICATIONS:  Continue prenatal vitamins.  The  patient was instructed to continue bedrest at home and follow up in the  office in 1 week.      Charles A. Clearance Coots, M.D.  Electronically Signed     CAH/MEDQ  D:  07/02/2006  T:  07/02/2006  Job:  [id number]

## 2010-12-24 ENCOUNTER — Inpatient Hospital Stay (HOSPITAL_COMMUNITY)
Admission: AD | Admit: 2010-12-24 | Discharge: 2010-12-24 | Disposition: A | Discharge status: Home / Self Care | Payer: Self-pay | Source: Ambulatory Visit | Attending: Obstetrics & Gynecology | Admitting: Obstetrics & Gynecology

## 2010-12-24 ENCOUNTER — Inpatient Hospital Stay (HOSPITAL_COMMUNITY): Payer: Self-pay

## 2010-12-24 ENCOUNTER — Encounter (HOSPITAL_COMMUNITY): Payer: Self-pay | Admitting: Radiology

## 2010-12-24 DIAGNOSIS — O99891 Other specified diseases and conditions complicating pregnancy: Secondary | ICD-10-CM | POA: Insufficient documentation

## 2010-12-24 DIAGNOSIS — K5900 Constipation, unspecified: Secondary | ICD-10-CM

## 2010-12-24 DIAGNOSIS — R109 Unspecified abdominal pain: Secondary | ICD-10-CM | POA: Insufficient documentation

## 2010-12-24 DIAGNOSIS — K089 Disorder of teeth and supporting structures, unspecified: Secondary | ICD-10-CM

## 2010-12-24 DIAGNOSIS — O9989 Other specified diseases and conditions complicating pregnancy, childbirth and the puerperium: Secondary | ICD-10-CM

## 2010-12-24 LAB — CBC
Hemoglobin: 12.9 g/dL (ref 12.0–15.0)
MCH: 28.9 pg (ref 26.0–34.0)
Platelets: 230 10*3/uL (ref 150–400)
RBC: 4.47 MIL/uL (ref 3.87–5.11)
WBC: 8.9 10*3/uL (ref 4.0–10.5)

## 2010-12-24 LAB — URINALYSIS, ROUTINE W REFLEX MICROSCOPIC
Bilirubin Urine: NEGATIVE
Leukocytes, UA: NEGATIVE
Nitrite: NEGATIVE
Specific Gravity, Urine: 1.01 (ref 1.005–1.030)
Urobilinogen, UA: 0.2 mg/dL (ref 0.0–1.0)
pH: 7 (ref 5.0–8.0)

## 2010-12-24 LAB — HCG, QUANTITATIVE, PREGNANCY: hCG, Beta Chain, Quant, S: 6134 m[IU]/mL — ABNORMAL HIGH (ref <5)

## 2010-12-24 LAB — WET PREP, GENITAL
Trich, Wet Prep: NONE SEEN
Yeast Wet Prep HPF POC: NONE SEEN

## 2010-12-24 LAB — POCT PREGNANCY, URINE: Preg Test, Ur: POSITIVE

## 2010-12-24 LAB — ABO/RH: ABO/RH(D): AB POS

## 2011-01-22 ENCOUNTER — Encounter (HOSPITAL_COMMUNITY): Payer: Self-pay | Admitting: *Deleted

## 2011-01-22 ENCOUNTER — Inpatient Hospital Stay (HOSPITAL_COMMUNITY)
Admission: AD | Admit: 2011-01-22 | Discharge: 2011-01-23 | Disposition: A | Discharge status: Home / Self Care | Payer: Self-pay | Source: Ambulatory Visit | Attending: Obstetrics | Admitting: Obstetrics

## 2011-01-22 DIAGNOSIS — K089 Disorder of teeth and supporting structures, unspecified: Secondary | ICD-10-CM | POA: Insufficient documentation

## 2011-01-22 DIAGNOSIS — K0889 Other specified disorders of teeth and supporting structures: Secondary | ICD-10-CM

## 2011-01-22 DIAGNOSIS — K029 Dental caries, unspecified: Secondary | ICD-10-CM | POA: Insufficient documentation

## 2011-01-22 DIAGNOSIS — O9989 Other specified diseases and conditions complicating pregnancy, childbirth and the puerperium: Secondary | ICD-10-CM | POA: Insufficient documentation

## 2011-01-22 NOTE — Progress Notes (Signed)
Pt states she has a cracked tooth that hurts really bad-states she has been taking a lot of tyleno PO for the pain- sttes she thinks she has taken too much tyelno and now her stomach hurts really bad-also states she has nausea associated with the pregnancy

## 2011-01-23 ENCOUNTER — Encounter (HOSPITAL_COMMUNITY): Payer: Self-pay | Admitting: *Deleted

## 2011-01-23 LAB — URINALYSIS, ROUTINE W REFLEX MICROSCOPIC
Bilirubin Urine: NEGATIVE
Glucose, UA: NEGATIVE mg/dL
Ketones, ur: NEGATIVE mg/dL
Specific Gravity, Urine: <1.005 — ABNORMAL LOW (ref 1.005–1.030)
pH: 5.5 (ref 5.0–8.0)

## 2011-01-23 NOTE — ED Provider Notes (Signed)
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 She comes in tonight complaining of severe toothache. 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 He states she's been taking Tylenol since Friday. He has not had any Tylenol since noon today. The patient is driving herself. She does not have anyone to pick her up. Denies lower abdominal pain, vaginal discharge, bleeding, or any other problems at this time.  Chief Complaint  Patient presents with  . Abdominal Pain   HPI  OB History    Grav Para Term Preterm Abortions TAB SAB Ect Mult Living   2 1 1       1       Past Medical History  Diagnosis Date  . No pertinent past medical history     Past Surgical History  Procedure Date  . No past surgeries     No family history on file.  History  Substance Use Topics  . Smoking status: Never Smoker   . Smokeless tobacco: Not on file  . Alcohol Use: No    Allergies:  Allergies  Allergen Reactions  . Latex Itching and Rash    Prescriptions prior to admission  Medication Sig Dispense Refill  . acetaminophen (TYLENOL) 500 MG tablet Take 1,000-1,500 mg by mouth every 6 (six) hours as needed. pain       . ibuprofen (ADVIL,MOTRIN) 200 MG tablet Take 800 mg by mouth every 6 (six) hours as needed. pain       . prenatal vitamin w/FE, FA (PRENATAL 1 + 1) 27-1 MG TABS Take 1 tablet by mouth daily.        . promethazine (PHENERGAN) 25 MG tablet Take 25 mg by mouth every 6 (six) hours as needed. nausea         Review of Systems  Constitutional: Negative for fever and malaise/fatigue.  HENT: Negative for sore throat.   Cardiovascular: Negative for chest pain.  Gastrointestinal: Positive for nausea. Negative for vomiting, abdominal pain, diarrhea and constipation.  Genitourinary: Negative for dysuria, urgency, frequency, hematuria and flank pain.  Neurological: Negative for dizziness and headaches.  Psychiatric/Behavioral: Negative for depression and suicidal  ideas.   Physical Exam   Blood pressure 128/76, pulse 63, temperature 99 F (37.2 C), resp. rate 20, height 6\' 5"  (1.956 m), weight 162 lb (73.483 kg), last menstrual period 06/29/2010.  Physical Exam  Constitutional: She appears well-developed and well-nourished. No distress.  HENT:  Head: Normocephalic and atraumatic.  Mouth/Throat: Dental caries present.    GI: Soft. Normal appearance. There is no tenderness.  Skin: She is not diaphoretic.    MAU Course  Procedures  Results for orders placed during the hospital encounter of 01/22/11 (from the past 24 hour(s))  URINALYSIS, ROUTINE W REFLEX MICROSCOPIC     Status: Abnormal   Collection Time   01/22/11 11:47 PM      Component Value Range   Color, Urine YELLOW  YELLOW    Appearance CLEAR  CLEAR    Specific Gravity, Urine <1.005 (*) 1.005 - 1.030    pH 5.5  5.0 - 8.0    Glucose, UA NEGATIVE  NEGATIVE (mg/dL)   Hgb urine dipstick NEGATIVE  NEGATIVE    Bilirubin Urine NEGATIVE  NEGATIVE    Ketones, ur NEGATIVE  NEGATIVE (mg/dL)   Protein, ur NEGATIVE  NEGATIVE (mg/dL)   Urobilinogen, UA 0.2  0.0 - 1.0 (mg/dL)   Nitrite NEGATIVE  NEGATIVE    Leukocytes, UA NEGATIVE  NEGATIVE  Assessment and Plan  Tooth pain: At this time there is no pain medication I can give the patient because she is driving herself. Advise her to keep her scheduled appointment with a dentist later today. She states she is no longer having abdominal pain at this time. I advised her to stop taking Tylenol. She understood and agreed.  Clinton Gallant. Rice III, DrHSc, MPAS, PA-C  01/23/2011, 12:46 AM   Henrietta Hoover, PA 01/23/11 0050

## 2011-02-09 ENCOUNTER — Inpatient Hospital Stay (HOSPITAL_COMMUNITY)
Admission: AD | Admit: 2011-02-09 | Discharge: 2011-02-09 | Disposition: A | Discharge status: Home / Self Care | Payer: Medicaid Other | Source: Ambulatory Visit | Attending: Obstetrics & Gynecology | Admitting: Obstetrics & Gynecology

## 2011-02-09 ENCOUNTER — Encounter (HOSPITAL_COMMUNITY): Payer: Self-pay | Admitting: *Deleted

## 2011-02-09 DIAGNOSIS — B373 Candidiasis of vulva and vagina: Secondary | ICD-10-CM

## 2011-02-09 DIAGNOSIS — R1032 Left lower quadrant pain: Secondary | ICD-10-CM | POA: Insufficient documentation

## 2011-02-09 DIAGNOSIS — B3731 Acute candidiasis of vulva and vagina: Secondary | ICD-10-CM

## 2011-02-09 LAB — URINALYSIS, ROUTINE W REFLEX MICROSCOPIC
Bilirubin Urine: NEGATIVE
Hgb urine dipstick: NEGATIVE
Nitrite: NEGATIVE
Specific Gravity, Urine: 1.015 (ref 1.005–1.030)
Urobilinogen, UA: 0.2 mg/dL (ref 0.0–1.0)
pH: 6.5 (ref 5.0–8.0)

## 2011-02-09 LAB — URINE MICROSCOPIC-ADD ON

## 2011-02-09 MED ORDER — FLUCONAZOLE 150 MG PO TABS: 150.0000 mg | ORAL_TABLET | Freq: Once | ORAL | Status: AC

## 2011-02-09 NOTE — Progress Notes (Signed)
Onset of left side abdominal pain worsening, has had two cysts on left ovary, had normal bowel movement this morning, took TUMS did not help.

## 2011-02-09 NOTE — ED Provider Notes (Signed)
History   Pt presents today c/o vag dc and LLQ pain. She states the pain is worse with movement. She denies vag bleeding, fever, or any other sx at this time.  Chief Complaint  Patient presents with  . Abdominal Pain   HPI  OB History    Grav Para Term Preterm Abortions TAB SAB Ect Mult Living   2 1 1       1       Past Medical History  Diagnosis Date  . No pertinent past medical history     Past Surgical History  Procedure Date  . No past surgeries     Family History  Problem Relation Age of Onset  . Asthma Mother   . Diabetes Maternal Grandmother   . Hypertension Maternal Grandmother   . Asthma Maternal Grandfather     History  Substance Use Topics  . Smoking status: Never Smoker   . Smokeless tobacco: Not on file  . Alcohol Use: No    Allergies:  Allergies  Allergen Reactions  . Latex Itching and Rash    Prescriptions prior to admission  Medication Sig Dispense Refill  . acetaminophen (TYLENOL) 500 MG tablet Take 1,000-1,500 mg by mouth every 6 (six) hours as needed. pain       . ibuprofen (ADVIL,MOTRIN) 200 MG tablet Take 800 mg by mouth every 6 (six) hours as needed. pain       . prenatal vitamin w/FE, FA (PRENATAL 1 + 1) 27-1 MG TABS Take 1 tablet by mouth daily.        . promethazine (PHENERGAN) 25 MG tablet Take 25 mg by mouth every 6 (six) hours as needed. nausea         Review of Systems  Constitutional: Negative for fever.  Gastrointestinal: Positive for abdominal pain. Negative for nausea, vomiting, diarrhea and constipation.  Genitourinary: Negative for dysuria, urgency, frequency and hematuria.  Neurological: Negative for dizziness and headaches.  Psychiatric/Behavioral: Negative for depression and suicidal ideas.   Physical Exam   Blood pressure 132/79, pulse 94, temperature 99.6 F (37.6 C), temperature source Oral, resp. rate 16, height 5\' 4"  (1.626 m), weight 160 lb (72.576 kg).  Physical Exam  Constitutional: She is oriented to  person, place, and time. She appears well-developed and well-nourished. No distress.  HENT:  Head: Normocephalic and atraumatic.  Eyes: EOM are normal. Pupils are equal, round, and reactive to light.  GI: Soft. She exhibits no distension and no mass. There is no tenderness. There is no rebound and no guarding.  Genitourinary: No bleeding around the vagina. Vaginal discharge found.       Vag dc consistent with yeast is present. Cervix is Lg/closed.   Neurological: She is alert and oriented to person, place, and time.  Skin: Skin is warm and dry. She is not diaphoretic.  Psychiatric: She has a normal mood and affect. Her behavior is normal. Judgment and thought content normal.    MAU Course  Procedures  FHTs in the 160s.  Assessment and Plan  Vaginal yeast: will give Rx for diflucan.  Pain: discussed with pt at length. She is likely having round ligament pain. She has f/u scheduled with her OB provider. Discussed diet, activity, risks, and precautions.  Clinton Gallant. Rice III, DrHSc, MPAS, PA-C  02/09/2011, 11:45 AM

## 2011-03-13 ENCOUNTER — Inpatient Hospital Stay (HOSPITAL_COMMUNITY)
Admission: AD | Admit: 2011-03-13 | Discharge: 2011-03-13 | Disposition: A | Discharge status: Home / Self Care | Payer: Self-pay | Source: Ambulatory Visit | Attending: Obstetrics & Gynecology | Admitting: Obstetrics & Gynecology

## 2011-03-13 ENCOUNTER — Encounter (HOSPITAL_COMMUNITY): Payer: Self-pay | Admitting: *Deleted

## 2011-03-13 DIAGNOSIS — B3731 Acute candidiasis of vulva and vagina: Secondary | ICD-10-CM | POA: Insufficient documentation

## 2011-03-13 DIAGNOSIS — B373 Candidiasis of vulva and vagina: Secondary | ICD-10-CM | POA: Insufficient documentation

## 2011-03-13 DIAGNOSIS — L293 Anogenital pruritus, unspecified: Secondary | ICD-10-CM | POA: Insufficient documentation

## 2011-03-13 LAB — WET PREP, GENITAL: Clue Cells Wet Prep HPF POC: NONE SEEN

## 2011-03-13 LAB — URINALYSIS, ROUTINE W REFLEX MICROSCOPIC
Hgb urine dipstick: NEGATIVE
Nitrite: NEGATIVE
Specific Gravity, Urine: 1.01 (ref 1.005–1.030)
Urobilinogen, UA: 0.2 mg/dL (ref 0.0–1.0)

## 2011-03-13 LAB — URINE MICROSCOPIC-ADD ON

## 2011-03-13 MED ORDER — FLUCONAZOLE 200 MG PO TABS
200.0000 mg | ORAL_TABLET | Freq: Once | ORAL | Status: AC
  Administered 2011-03-13: 200 mg via ORAL
  Filled 2011-03-13: qty 1

## 2011-03-13 MED ORDER — FLUCONAZOLE 150 MG PO TABS: 150.0000 mg | ORAL_TABLET | Freq: Once | ORAL | Status: AC

## 2011-03-13 NOTE — Progress Notes (Signed)
Pt states was seen here recently for yeast infection. Ran out of her soap for sensitive skin, used partner's irish spring soap, bilateral labia are sore, red/bleeding, has severe itching.

## 2011-03-13 NOTE — Progress Notes (Signed)
Pt states she used Monistat cream externally last night, but didn't help, also tried cold shower & cold compress, applied garlic vaginally to try to relieve itching - nothing worked.

## 2011-03-13 NOTE — ED Provider Notes (Signed)
History     CSN: [id number] Arrival date & time: 03/13/2011  8:20 AM   Chief Complaint  Patient presents with  . Vaginal Itching     (Include location/radiation/quality/duration/timing/severity/associated sxs/prior treatment) HPI [name] is a 25 y.o. female @ [redacted] weeks gestation who presents to MAU for vaginal itching and discharge that started a week ago. Symptoms have gotten worse and patient states she has scratched until she is bleeding. Symptoms started after changing soap. The history was provided by the patient.   Past Medical History  Diagnosis Date  . No pertinent past medical history      Past Surgical History  Procedure Date  . No past surgeries     Family History  Problem Relation Age of Onset  . Asthma Mother   . Diabetes Maternal Grandmother   . Hypertension Maternal Grandmother   . Asthma Maternal Grandfather     History  Substance Use Topics  . Smoking status: Never Smoker   . Smokeless tobacco: Not on file  . Alcohol Use: No    OB History    Grav Para Term Preterm Abortions TAB SAB Ect Mult Living   2 1 1       1       Review of Systems  Constitutional: Negative for fever, chills, diaphoresis and fatigue.  HENT: Negative for ear pain, congestion, sore throat, facial swelling, neck pain, neck stiffness, dental problem and sinus pressure.   Eyes: Negative for photophobia, pain and discharge.  Respiratory: Positive for cough. Negative for chest tightness and wheezing.   Gastrointestinal: Negative for nausea, vomiting, abdominal pain, diarrhea, constipation and abdominal distention.  Genitourinary: Positive for vaginal discharge and vaginal pain. Negative for dysuria, frequency, flank pain and difficulty urinating.       Pregnant  Musculoskeletal: Negative for myalgias, back pain and gait problem.  Skin: Negative for color change and rash.  Neurological: Negative for dizziness, speech difficulty, weakness, light-headedness, numbness and  headaches.  Psychiatric/Behavioral: Negative for confusion and agitation.    Allergies  Latex  Home Medications  No current outpatient prescriptions on file.  Physical Exam    BP 130/73  Pulse 98  Temp(Src) 98.7 F (37.1 C) (Oral)  Resp 16  Ht 5\' 4"  (1.626 m)  Wt 167 lb (75.751 kg)  BMI 28.67 kg/m2  Physical Exam  Nursing note and vitals reviewed. Constitutional: She is oriented to person, place, and time. She appears well-developed and well-nourished.  HENT:  Head: Normocephalic.  Eyes: EOM are normal.  Neck: Neck supple.  Pulmonary/Chest: Effort normal.  Abdominal: Soft. There is no tenderness.       +FHT by doppler  Genitourinary:       Vaginal irritation, thick white cheesy discharge. Cervix closed, no CMT. Uterus 16 week size.  Musculoskeletal: Normal range of motion.  Neurological: She is alert and oriented to person, place, and time. No cranial nerve deficit.  Skin: Skin is warm and dry.    ED Course  Procedures  Results for orders placed during the hospital encounter of 02/09/11  URINALYSIS, ROUTINE W REFLEX MICROSCOPIC      Component Value Range   Color, Urine YELLOW  YELLOW    Appearance CLEAR  CLEAR    Specific Gravity, Urine 1.015  1.005 - 1.030    pH 6.5  5.0 - 8.0    Glucose, UA NEGATIVE  NEGATIVE (mg/dL)   Hgb urine dipstick NEGATIVE  NEGATIVE    Bilirubin Urine NEGATIVE  NEGATIVE  Ketones, ur NEGATIVE  NEGATIVE (mg/dL)   Protein, ur NEGATIVE  NEGATIVE (mg/dL)   Urobilinogen, UA 0.2  0.0 - 1.0 (mg/dL)   Nitrite NEGATIVE  NEGATIVE    Leukocytes, UA MODERATE (*) NEGATIVE   URINE MICROSCOPIC-ADD ON      Component Value Range   Squamous Epithelial / LPF FEW (*) RARE    WBC, UA 3-6  <3 (WBC/hpf)   Bacteria, UA FEW (*) RARE    Assessment: Yeast infection  Plan: Diflucan  Follow up with Dr. Tamela Oddi  Return  Here as needed      [facility], NP 03/13/11 1041

## 2011-03-14 LAB — GC/CHLAMYDIA PROBE AMP, GENITAL: GC Probe Amp, Genital: NEGATIVE

## 2011-04-01 LAB — HIV ANTIBODY (ROUTINE TESTING W REFLEX): HIV: NONREACTIVE

## 2011-04-08 LAB — RPR: RPR: NONREACTIVE

## 2011-04-08 LAB — HEPATITIS B SURFACE ANTIGEN: Hepatitis B Surface Ag: NEGATIVE

## 2011-04-09 LAB — URINALYSIS, ROUTINE W REFLEX MICROSCOPIC
Bilirubin Urine: NEGATIVE
Ketones, ur: NEGATIVE
Nitrite: NEGATIVE
Protein, ur: NEGATIVE
Urobilinogen, UA: 0.2

## 2011-04-09 LAB — POCT PREGNANCY, URINE
Operator id: 192351
Preg Test, Ur: POSITIVE

## 2011-06-25 NOTE — L&D Delivery Note (Signed)
Delivery Note Pt progressed to complete and pushed well.  At 12:04 AM a viable and healthy female was delivered via Vaginal, Spontaneous Delivery (Presentation: Middle Occiput Anterior).  APGAR: 8, 9; weight 8 lb 10 oz (3912 g).   Placenta status: Intact, Spontaneous.  Cord: 3 vessels with the following complications: None.  True knot in cord.    Anesthesia: Epidural  Episiotomy: None Lacerations: 1st degree Suture Repair: none Est. Blood Loss (mL): 500  Mom to postpartum.  Baby to nursery-stable.  Amori Colomb D 08/20/2011, 12:23 AM

## 2011-07-13 ENCOUNTER — Inpatient Hospital Stay (HOSPITAL_COMMUNITY)
Admission: AD | Admit: 2011-07-13 | Discharge: 2011-07-13 | Disposition: A | Discharge status: Home / Self Care | Payer: Medicaid Other | Source: Ambulatory Visit | Attending: Obstetrics and Gynecology | Admitting: Obstetrics and Gynecology

## 2011-07-13 ENCOUNTER — Encounter (HOSPITAL_COMMUNITY): Payer: Self-pay

## 2011-07-13 DIAGNOSIS — R6889 Other general symptoms and signs: Secondary | ICD-10-CM

## 2011-07-13 DIAGNOSIS — O99891 Other specified diseases and conditions complicating pregnancy: Secondary | ICD-10-CM | POA: Insufficient documentation

## 2011-07-13 DIAGNOSIS — J111 Influenza due to unidentified influenza virus with other respiratory manifestations: Secondary | ICD-10-CM

## 2011-07-13 DIAGNOSIS — J3489 Other specified disorders of nose and nasal sinuses: Secondary | ICD-10-CM | POA: Insufficient documentation

## 2011-07-13 MED ORDER — OSELTAMIVIR PHOSPHATE 75 MG PO CAPS: 75.0000 mg | ORAL_CAPSULE | Freq: Two times a day (BID) | ORAL | Status: AC

## 2011-07-13 NOTE — ED Provider Notes (Signed)
History     Chief Complaint  Patient presents with  . Nasal Congestion   HPI 26 y.o. G2P1001 at [redacted]w[redacted]d c/o sore throat, chills, nasal congestion x 1 day. Took Tylenol about 4 hours ago. Son is being treated for strep throat.     Past Medical History  Diagnosis Date  . No pertinent past medical history     Past Surgical History  Procedure Date  . No past surgeries     Family History  Problem Relation Age of Onset  . Asthma Mother   . Diabetes Maternal Grandmother   . Hypertension Maternal Grandmother   . Asthma Maternal Grandfather     History  Substance Use Topics  . Smoking status: Never Smoker   . Smokeless tobacco: Not on file  . Alcohol Use: No    Allergies:  Allergies  Allergen Reactions  . Latex Itching and Rash    Prescriptions prior to admission  Medication Sig Dispense Refill  . acetaminophen (TYLENOL) 500 MG tablet Take 1,000-1,500 mg by mouth every 6 (six) hours as needed. pain       . acetaminophen-codeine (TYLENOL #3) 300-30 MG per tablet Take 1 tablet by mouth every 4 (four) hours as needed. For pain       . ibuprofen (ADVIL,MOTRIN) 200 MG tablet Take 800 mg by mouth every 6 (six) hours as needed. pain       . prenatal vitamin w/FE, FA (PRENATAL 1 + 1) 27-1 MG TABS Take 1 tablet by mouth daily.        . promethazine (PHENERGAN) 25 MG tablet Take 25 mg by mouth every 6 (six) hours as needed. nausea      . pseudoephedrine (SUDAFED) 30 MG tablet Take 30 mg by mouth every 4 (four) hours as needed. For decongestant         Review of Systems  Constitutional: Positive for chills.  HENT: Positive for congestion and sore throat.   Respiratory: Negative.   Cardiovascular: Negative.   Gastrointestinal: Negative for nausea, vomiting, abdominal pain, diarrhea and constipation.  Genitourinary: Negative for dysuria, urgency, frequency, hematuria and flank pain.       Negative for vaginal bleeding, cramping/contractions  Musculoskeletal: Negative.     Neurological: Negative.   Psychiatric/Behavioral: Negative.    Physical Exam   Blood pressure 138/72, pulse 107, temperature 99.4 F (37.4 C), temperature source Oral, resp. rate 18, height 5\' 3"  (1.6 m), weight 201 lb 6.4 oz (91.354 kg).  Physical Exam  Nursing note and vitals reviewed. Constitutional: She is oriented to person, place, and time. She appears well-developed and well-nourished. No distress.  HENT:  Head: Normocephalic and atraumatic.  Nose: Nose normal.  Mouth/Throat: Oropharynx is clear and moist. No oropharyngeal exudate.  Neck: Normal range of motion.  Cardiovascular: Normal rate, regular rhythm and normal heart sounds.   Respiratory: Breath sounds normal. No stridor. No respiratory distress. She has no wheezes. She has no rales.  GI: Soft. There is no tenderness.  Musculoskeletal: Normal range of motion.  Lymphadenopathy:    She has no cervical adenopathy.  Neurological: She is alert and oriented to person, place, and time.  Skin: Skin is warm and dry.  Psychiatric: She has a normal mood and affect.    MAU Course  Procedures  No results found for this or any previous visit (from the past 24 hour(s)).   Assessment and Plan  26 y.o. G2P1001 at [redacted]w[redacted]d Influenza like illness - will treat with Tamiflu  Strep test pending -  no signs of strep on exam, will treat if test is positive  Ronasia Isola 07/13/2011, 6:39 PM

## 2011-07-13 NOTE — Progress Notes (Signed)
Pt reports having a sore throat that started 3am. Pt reports nasal congestion and chills. Son dx with strep throat on Monday.

## 2011-07-26 LAB — STREP B DNA PROBE: GBS: POSITIVE

## 2011-08-09 ENCOUNTER — Encounter (HOSPITAL_COMMUNITY): Payer: Self-pay | Admitting: *Deleted

## 2011-08-09 ENCOUNTER — Inpatient Hospital Stay (HOSPITAL_COMMUNITY)
Admission: AD | Admit: 2011-08-09 | Discharge: 2011-08-09 | Disposition: A | Discharge status: Home / Self Care | Payer: Medicaid Other | Attending: Obstetrics and Gynecology | Admitting: Obstetrics and Gynecology

## 2011-08-09 DIAGNOSIS — O169 Unspecified maternal hypertension, unspecified trimester: Secondary | ICD-10-CM

## 2011-08-09 DIAGNOSIS — O10019 Pre-existing essential hypertension complicating pregnancy, unspecified trimester: Secondary | ICD-10-CM | POA: Insufficient documentation

## 2011-08-09 DIAGNOSIS — O139 Gestational [pregnancy-induced] hypertension without significant proteinuria, unspecified trimester: Secondary | ICD-10-CM

## 2011-08-09 HISTORY — DX: Gestational (pregnancy-induced) hypertension without significant proteinuria, unspecified trimester: O13.9

## 2011-08-09 LAB — COMPREHENSIVE METABOLIC PANEL
ALT: 14 U/L (ref 0–35)
AST: 17 U/L (ref 0–37)
CO2: 22 mEq/L (ref 19–32)
Calcium: 8.9 mg/dL (ref 8.4–10.5)
Chloride: 105 mEq/L (ref 96–112)
Creatinine, Ser: 0.54 mg/dL (ref 0.50–1.10)
GFR calc Af Amer: >90 mL/min (ref >90)
GFR calc non Af Amer: >90 mL/min (ref >90)
Glucose, Bld: 104 mg/dL — ABNORMAL HIGH (ref 70–99)
Total Bilirubin: 0.1 mg/dL — ABNORMAL LOW (ref 0.3–1.2)

## 2011-08-09 LAB — URINALYSIS, ROUTINE W REFLEX MICROSCOPIC
Bilirubin Urine: NEGATIVE
Hgb urine dipstick: NEGATIVE
Nitrite: NEGATIVE
Protein, ur: NEGATIVE mg/dL
Specific Gravity, Urine: <1.005 — ABNORMAL LOW (ref 1.005–1.030)
Urobilinogen, UA: 0.2 mg/dL (ref 0.0–1.0)

## 2011-08-09 LAB — CBC
Hemoglobin: 11.2 g/dL — ABNORMAL LOW (ref 12.0–15.0)
MCH: 26.9 pg (ref 26.0–34.0)
MCV: 79.9 fL (ref 78.0–100.0)
RBC: 4.17 MIL/uL (ref 3.87–5.11)
WBC: 9.1 10*3/uL (ref 4.0–10.5)

## 2011-08-09 NOTE — Discharge Instructions (Signed)
Hypertension in Pregnancy Hypertension means high blood pressure. Blood pressure moves blood in your body. Sometimes, the force that moves the blood becomes too strong. When you are pregnant, this condition should be watched carefully. HOME CARE   Follow your caregiver's instructions carefully.   Only take medicine as told by your doctor.  GET HELP RIGHT AWAY IF:  You have belly (abdominal) pain.   You have sudden puffiness (swelling) in the hands, ankles, or face.   You gain 4 pounds (1.8 kilograms) or more in 1 week.   You throw up (vomit) repeatedly.   You have vaginal bleeding.   You do not feel the baby moving as much.   You have a headache.   You have blurred or double vision.   You have muscle twitching or spasms.   You have shortness of breath.   You have blue fingernails and lips.   You have blood in your pee (urine).  MAKE SURE YOU:  Understand these instructions.   Will watch your condition.   Will get help right away if you are not doing well.  Document Released: 07/13/2010 Document Revised: 09/25/2010 Document Reviewed: 04/28/2007 [employer] Patient Information 2012 [employer]. 

## 2011-08-09 NOTE — ED Provider Notes (Signed)
History     Chief Complaint  Patient presents with  . Hypertension   HPI  Pt reports headache that started off and on today; headache is located in the frontal area, pain is rated 6/10 at this time.  Occasional blurry vision and feeling "weird".  Denies epigastric pain, contractions, vaginal bleeding, or leaking of fluid.    Past Medical History  Diagnosis Date  . Pregnancy induced hypertension     Past Surgical History  Procedure Date  . No past surgeries     Family History  Problem Relation Age of Onset  . Asthma Mother   . Diabetes Maternal Grandmother   . Hypertension Maternal Grandmother   . Asthma Maternal Grandfather   . Anesthesia problems Neg Hx     History  Substance Use Topics  . Smoking status: Never Smoker   . Smokeless tobacco: Never Used  . Alcohol Use: No    Allergies:  Allergies  Allergen Reactions  . Latex Itching and Rash    Prescriptions prior to admission  Medication Sig Dispense Refill  . acetaminophen (TYLENOL) 500 MG tablet Take 1,000-1,500 mg by mouth every 6 (six) hours as needed. pain       . zolpidem (AMBIEN) 10 MG tablet Take 10 mg by mouth at bedtime as needed. For sleep      . Menthol-Methyl Salicylate (MUSCLE RUB) 10-15 % CREA Apply topically as needed. For pain      . prenatal vitamin w/FE, FA (PRENATAL 1 + 1) 27-1 MG TABS Take 1 tablet by mouth daily.          Review of Systems  Constitutional: Negative.   Eyes: Positive for blurred vision.  Neurological: Positive for headaches.  All other systems reviewed and are negative.   Physical Exam   Blood pressure 142/81, pulse 123, temperature 97.8 F (36.6 C), temperature source Oral, resp. rate 18, height 5\' 4"  (1.626 m), weight 93.895 kg (207 lb), SpO2 100.00%.  Physical Exam  Constitutional: She is oriented to person, place, and time. She appears well-developed and well-nourished. No distress.  HENT:  Head: Normocephalic.  Neck: Normal range of motion. Neck supple.    Cardiovascular: Normal rate and regular rhythm.   Respiratory: Effort normal and breath sounds normal.  GI: Soft. Bowel sounds are normal. There is no tenderness.  Musculoskeletal:       Trace pedal edema  Neurological: She is alert and oriented to person, place, and time. She has normal reflexes. She displays normal reflexes.  Skin: Skin is warm and dry.    MAU Course  Procedures   Assessment and Plan  Report given to S. David Towson who assumes care of pt.  [facility] 08/09/2011, 7:28 PM      Blood pressure 116/66, pulse 100, temperature 97.8 F (36.6 C), temperature source Oral, resp. rate 18, height 5\' 4"  (1.626 m), weight 207 lb (93.895 kg), SpO2 100.00%.  MD Consult: Discussed labs, BP, and fetal tracing with Dr. Ambrose Mantle. Will d/c home, pt to FU in office next week.  Assessment: Gestational HTN: benign and intermittent w/o s/s Pre-X  Plan: Discharge home with precautions FU in office as scheduled next week.  Ajia Chadderdon E. 9:13 PM

## 2011-08-19 ENCOUNTER — Encounter (HOSPITAL_COMMUNITY): Payer: Self-pay | Admitting: Anesthesiology

## 2011-08-19 ENCOUNTER — Inpatient Hospital Stay (HOSPITAL_COMMUNITY): Payer: Medicaid Other | Admitting: Anesthesiology

## 2011-08-19 ENCOUNTER — Inpatient Hospital Stay (HOSPITAL_COMMUNITY)
Admission: AD | Admit: 2011-08-19 | Discharge: 2011-08-22 | DRG: 775 | Disposition: A | Discharge status: Home / Self Care | Payer: Medicaid Other | Source: Ambulatory Visit | Attending: Obstetrics and Gynecology | Admitting: Obstetrics and Gynecology

## 2011-08-19 ENCOUNTER — Encounter (HOSPITAL_COMMUNITY): Payer: Self-pay | Admitting: *Deleted

## 2011-08-19 DIAGNOSIS — Z22330 Carrier of Group B streptococcus: Secondary | ICD-10-CM

## 2011-08-19 DIAGNOSIS — O139 Gestational [pregnancy-induced] hypertension without significant proteinuria, unspecified trimester: Principal | ICD-10-CM

## 2011-08-19 DIAGNOSIS — O99892 Other specified diseases and conditions complicating childbirth: Secondary | ICD-10-CM | POA: Diagnosis present

## 2011-08-19 LAB — COMPREHENSIVE METABOLIC PANEL
ALT: 18 U/L (ref 0–35)
AST: 19 U/L (ref 0–37)
Albumin: 2.5 g/dL — ABNORMAL LOW (ref 3.5–5.2)
CO2: 22 mEq/L (ref 19–32)
Calcium: 8.7 mg/dL (ref 8.4–10.5)
Creatinine, Ser: 0.5 mg/dL (ref 0.50–1.10)
Sodium: 135 mEq/L (ref 135–145)
Total Protein: 5.6 g/dL — ABNORMAL LOW (ref 6.0–8.3)

## 2011-08-19 LAB — CBC
Hemoglobin: 11.7 g/dL — ABNORMAL LOW (ref 12.0–15.0)
MCH: 27 pg (ref 26.0–34.0)
MCHC: 33.8 g/dL (ref 30.0–36.0)
MCHC: 33.4 g/dL (ref 30.0–36.0)
Platelets: 175 10*3/uL (ref 150–400)
RDW: 16 % — ABNORMAL HIGH (ref 11.5–15.5)
RDW: 16.1 % — ABNORMAL HIGH (ref 11.5–15.5)

## 2011-08-19 LAB — RPR: RPR Ser Ql: NONREACTIVE

## 2011-08-19 LAB — URIC ACID: Uric Acid, Serum: 4.6 mg/dL (ref 2.4–7.0)

## 2011-08-19 MED ORDER — LACTATED RINGERS IV SOLN
INTRAVENOUS | Status: DC
  Administered 2011-08-19 (×2): via INTRAVENOUS

## 2011-08-19 MED ORDER — LIDOCAINE HCL 1.5 % IJ SOLN
INTRAMUSCULAR | Status: DC | PRN
  Administered 2011-08-19 (×2): 5 mL via EPIDURAL

## 2011-08-19 MED ORDER — DIPHENHYDRAMINE HCL 50 MG/ML IJ SOLN: 12.5000 mg | INTRAMUSCULAR | Status: DC | PRN

## 2011-08-19 MED ORDER — LACTATED RINGERS IV SOLN: 500.0000 mL | INTRAVENOUS | Status: DC | PRN

## 2011-08-19 MED ORDER — IBUPROFEN 600 MG PO TABS: 600.0000 mg | ORAL_TABLET | Freq: Four times a day (QID) | ORAL | Status: DC | PRN

## 2011-08-19 MED ORDER — EPHEDRINE 5 MG/ML INJ
10.0000 mg | INTRAVENOUS | Status: DC | PRN
  Filled 2011-08-19: qty 4

## 2011-08-19 MED ORDER — OXYTOCIN BOLUS FROM INFUSION
500.0000 mL | Freq: Once | INTRAVENOUS | Status: DC
  Filled 2011-08-19: qty 500
  Filled 2011-08-19: qty 1000

## 2011-08-19 MED ORDER — LACTATED RINGERS IV SOLN
500.0000 mL | Freq: Once | INTRAVENOUS | Status: AC
  Administered 2011-08-19: 500 mL via INTRAVENOUS

## 2011-08-19 MED ORDER — PENICILLIN G POTASSIUM 5000000 UNITS IJ SOLR
2.5000 10*6.[IU] | INTRAVENOUS | Status: DC
  Administered 2011-08-19 (×3): 2.5 10*6.[IU] via INTRAVENOUS
  Filled 2011-08-19 (×7): qty 2.5

## 2011-08-19 MED ORDER — FENTANYL 2.5 MCG/ML BUPIVACAINE 1/10 % EPIDURAL INFUSION (WH - ANES)
14.0000 mL/h | INTRAMUSCULAR | Status: DC
  Administered 2011-08-19: 14 mL/h via EPIDURAL
  Filled 2011-08-19 (×2): qty 60

## 2011-08-19 MED ORDER — LIDOCAINE HCL (PF) 1 % IJ SOLN: 30.0000 mL | INTRAMUSCULAR | Status: DC | PRN

## 2011-08-19 MED ORDER — ACETAMINOPHEN 325 MG PO TABS: 650.0000 mg | ORAL_TABLET | ORAL | Status: DC | PRN

## 2011-08-19 MED ORDER — DEXTROSE 5 % IV SOLN
5.0000 10*6.[IU] | Freq: Once | INTRAVENOUS | Status: AC
  Administered 2011-08-19: 5 10*6.[IU] via INTRAVENOUS
  Filled 2011-08-19: qty 5

## 2011-08-19 MED ORDER — FENTANYL CITRATE 0.05 MG/ML IJ SOLN
INTRAMUSCULAR | Status: AC
  Filled 2011-08-19: qty 2

## 2011-08-19 MED ORDER — OXYCODONE-ACETAMINOPHEN 5-325 MG PO TABS: 1.0000 | ORAL_TABLET | ORAL | Status: DC | PRN

## 2011-08-19 MED ORDER — OXYTOCIN 20 UNITS IN LACTATED RINGERS INFUSION - SIMPLE
1.0000 m[IU]/min | INTRAVENOUS | Status: DC
  Administered 2011-08-19: 2 m[IU]/min via INTRAVENOUS
  Filled 2011-08-19: qty 1000

## 2011-08-19 MED ORDER — MORPHINE SULFATE 0.5 MG/ML IJ SOLN
INTRAMUSCULAR | Status: AC
  Filled 2011-08-19: qty 10

## 2011-08-19 MED ORDER — CITRIC ACID-SODIUM CITRATE 334-500 MG/5ML PO SOLN: 30.0000 mL | ORAL | Status: DC | PRN

## 2011-08-19 MED ORDER — PHENYLEPHRINE 40 MCG/ML (10ML) SYRINGE FOR IV PUSH (FOR BLOOD PRESSURE SUPPORT): 80.0000 ug | PREFILLED_SYRINGE | INTRAVENOUS | Status: DC | PRN

## 2011-08-19 MED ORDER — ONDANSETRON HCL 4 MG/2ML IJ SOLN: 4.0000 mg | Freq: Four times a day (QID) | INTRAMUSCULAR | Status: DC | PRN

## 2011-08-19 MED ORDER — FENTANYL 2.5 MCG/ML BUPIVACAINE 1/10 % EPIDURAL INFUSION (WH - ANES)
INTRAMUSCULAR | Status: DC | PRN
  Administered 2011-08-19: 14 mL/h via EPIDURAL

## 2011-08-19 MED ORDER — PHENYLEPHRINE 40 MCG/ML (10ML) SYRINGE FOR IV PUSH (FOR BLOOD PRESSURE SUPPORT)
80.0000 ug | PREFILLED_SYRINGE | INTRAVENOUS | Status: DC | PRN
  Filled 2011-08-19: qty 5

## 2011-08-19 MED ORDER — OXYTOCIN 20 UNITS IN LACTATED RINGERS INFUSION - SIMPLE
125.0000 mL/h | Freq: Once | INTRAVENOUS | Status: AC
  Administered 2011-08-20: 999 mL/h via INTRAVENOUS

## 2011-08-19 MED ORDER — EPHEDRINE 5 MG/ML INJ: 10.0000 mg | INTRAVENOUS | Status: DC | PRN

## 2011-08-19 MED ORDER — TERBUTALINE SULFATE 1 MG/ML IJ SOLN: 0.2500 mg | Freq: Once | INTRAMUSCULAR | Status: AC | PRN

## 2011-08-19 NOTE — Anesthesia Procedure Notes (Signed)
Epidural Patient location during procedure: OB Start time: 08/19/2011 9:22 PM End time: 08/19/2011 9:27 PM Reason for block: procedure for pain  Staffing Anesthesiologist: Sandrea Hughs Performed by: anesthesiologist   Preanesthetic Checklist Completed: patient identified, site marked, surgical consent, pre-op evaluation, timeout performed, IV checked, risks and benefits discussed and monitors and equipment checked  Epidural Patient position: sitting Prep: site prepped and draped and DuraPrep Patient monitoring: continuous pulse ox and blood pressure Approach: midline Injection technique: LOR air  Needle:  Needle type: Tuohy  Needle gauge: 17 G Needle length: 9 cm Needle insertion depth: 5 cm cm Catheter type: closed end flexible Catheter size: 19 Gauge Catheter at skin depth: 10 cm Test dose: negative and 1.5% lidocaine  Assessment Sensory level: T8 Events: blood not aspirated, injection not painful, no injection resistance, negative IV test and no paresthesia

## 2011-08-19 NOTE — Progress Notes (Signed)
Feeling some ctx Afeb, VSS, BP 140/70-90 FHT- Cat I, irreg ctx on 20 mu/min pitocin VE-3/50/-3, vtx Continue pitocin and PCN, hopefully AROM next exam

## 2011-08-19 NOTE — H&P (Signed)
[name] is a 26 y.o. female, G2 P1001 presenting for induction due to gestational hypertension.  She has had slightly elevated BPs off and on in the office, normal labs, no significant symptoms.  BP in the office today 140/70/ here 140/90.  Prenatal care essentially uncomplicated otherwise, see prenatal records for complete history.  Maternal Medical History:  Contractions: Frequency: irregular.    Fetal activity: Perceived fetal activity is normal.      OB History    Grav Para Term Preterm Abortions TAB SAB Ect Mult Living   2 1 1       1     SVD at 42 weeks, PIH, 9 lbs 1 oz  Past Medical History  Diagnosis Date  . Pregnancy induced hypertension    Past Surgical History  Procedure Date  . No past surgeries    Family History: family history includes Asthma in her maternal grandfather and mother; Diabetes in her maternal grandmother; and Hypertension in her maternal grandmother.  There is no history of Anesthesia problems. Social History:  reports that she has never smoked. She has never used smokeless tobacco. She reports that she does not drink alcohol or use illicit drugs.  Review of Systems  Respiratory: Negative.   Cardiovascular: Negative.     Dilation: 2 Effacement (%): Thick Station: -3 Exam by:: Montez Morita, RNC Blood pressure 136/90, pulse 102, temperature 97.8 F (36.6 C), resp. rate 18, height 5\' 4"  (1.626 m), weight 97.07 kg (214 lb). Maternal Exam:  Uterine Assessment: Contraction strength is mild.  Abdomen: Patient reports no abdominal tenderness. Estimated fetal weight is 8 lbs.   Fetal presentation: vertex  Introitus: Normal vulva. Normal vagina.  Pelvis: adequate for delivery.   Cervix: Cervix evaluated by digital exam.     Fetal Exam Fetal Monitor Review: Mode: ultrasound.   Baseline rate: 140.  Variability: moderate (6-25 bpm).   Pattern: accelerations present and no decelerations.    Fetal State Assessment: Category I - tracings are  normal.     Physical Exam  Constitutional: She appears well-developed and well-nourished.  Cardiovascular: Normal rate, regular rhythm and normal heart sounds.   No murmur heard. Respiratory: Effort normal and breath sounds normal. No respiratory distress.  GI: Soft. Bowel sounds are normal.       Gravid     Prenatal labs: ABO, Rh: --/--/AB POS (07/02 1135) Antibody: Negative (10/15 0000) Rubella: Immune (10/15 0000) RPR: Nonreactive (10/15 0000)  HBsAg: Negative (10/15 0000)  HIV: Non-reactive (10/08 0000)  GBS: Positive (02/01 0000)   Assessment/Plan: IUP at 39 weeks with mild gestational hypertension and borderline favorable cervix.  Will admit for pitocin induction, PIH labs today are normal.  Monitor BP, treat if necessary.     Melroy Bougher D 08/19/2011, 12:32 PM

## 2011-08-19 NOTE — Progress Notes (Signed)
Feeling more ctx Afeb, VSS FHT- Cat I, ctx q 3-5 minutes VE- 3/70/-2, vtx, AROM-light meconium Continue pitocin and PCN, monitor progress

## 2011-08-19 NOTE — Anesthesia Preprocedure Evaluation (Signed)
Anesthesia Evaluation  Patient identified by MRN, date of birth, ID band Patient awake    Reviewed: Allergy & Precautions, H&P , NPO status , Patient's Chart, lab work & pertinent test results  Airway Mallampati: II TM Distance: >3 FB Neck ROM: full    Dental No notable dental hx.    Pulmonary neg pulmonary ROS,    Pulmonary exam normal       Cardiovascular     Neuro/Psych Negative Psych ROS   GI/Hepatic negative GI ROS, Neg liver ROS,   Endo/Other  Negative Endocrine ROS  Renal/GU negative Renal ROS  Genitourinary negative   Musculoskeletal negative musculoskeletal ROS (+)   Abdominal (+) obese,   Peds negative pediatric ROS (+)  Hematology  (+) REFUSES BLOOD PRODUCTS,   Anesthesia Other Findings   Reproductive/Obstetrics (+) Pregnancy                           Anesthesia Physical Anesthesia Plan  ASA: III  Anesthesia Plan: Epidural   Post-op Pain Management:    Induction:   Airway Management Planned:   Additional Equipment:   Intra-op Plan:   Post-operative Plan:   Informed Consent: I have reviewed the patients History and Physical, chart, labs and discussed the procedure including the risks, benefits and alternatives for the proposed anesthesia with the patient or authorized representative who has indicated his/her understanding and acceptance.     Plan Discussed with:   Anesthesia Plan Comments:         Anesthesia Quick Evaluation

## 2011-08-20 ENCOUNTER — Encounter (HOSPITAL_COMMUNITY): Payer: Self-pay | Admitting: *Deleted

## 2011-08-20 LAB — CBC
Hemoglobin: 10.3 g/dL — ABNORMAL LOW (ref 12.0–15.0)
MCH: 27 pg (ref 26.0–34.0)
MCHC: 33.7 g/dL (ref 30.0–36.0)
RDW: 16.1 % — ABNORMAL HIGH (ref 11.5–15.5)

## 2011-08-20 MED ORDER — ONDANSETRON HCL 4 MG PO TABS: 4.0000 mg | ORAL_TABLET | ORAL | Status: DC | PRN

## 2011-08-20 MED ORDER — PRENATAL MULTIVITAMIN CH
1.0000 | ORAL_TABLET | Freq: Every day | ORAL | Status: DC
  Administered 2011-08-20 – 2011-08-22 (×3): 1 via ORAL
  Filled 2011-08-20 (×3): qty 1

## 2011-08-20 MED ORDER — OXYCODONE-ACETAMINOPHEN 5-325 MG PO TABS
1.0000 | ORAL_TABLET | ORAL | Status: DC | PRN
  Administered 2011-08-22 (×2): 1 via ORAL
  Filled 2011-08-20 (×3): qty 1

## 2011-08-20 MED ORDER — IBUPROFEN 600 MG PO TABS
600.0000 mg | ORAL_TABLET | Freq: Four times a day (QID) | ORAL | Status: DC
  Administered 2011-08-20 – 2011-08-22 (×10): 600 mg via ORAL
  Filled 2011-08-20 (×10): qty 1

## 2011-08-20 MED ORDER — DIBUCAINE 1 % RE OINT: 1.0000 "application " | TOPICAL_OINTMENT | RECTAL | Status: DC | PRN

## 2011-08-20 MED ORDER — LANOLIN HYDROUS EX OINT: TOPICAL_OINTMENT | CUTANEOUS | Status: DC | PRN

## 2011-08-20 MED ORDER — MEASLES, MUMPS & RUBELLA VAC ~~LOC~~ INJ
0.5000 mL | INJECTION | Freq: Once | SUBCUTANEOUS | Status: DC
  Filled 2011-08-20: qty 0.5

## 2011-08-20 MED ORDER — WITCH HAZEL-GLYCERIN EX PADS
1.0000 "application " | MEDICATED_PAD | CUTANEOUS | Status: DC | PRN
  Administered 2011-08-20: 1 via TOPICAL

## 2011-08-20 MED ORDER — OXYTOCIN 20 UNITS IN LACTATED RINGERS INFUSION - SIMPLE: 125.0000 mL/h | INTRAVENOUS | Status: DC | PRN

## 2011-08-20 MED ORDER — SENNOSIDES-DOCUSATE SODIUM 8.6-50 MG PO TABS
2.0000 | ORAL_TABLET | Freq: Every day | ORAL | Status: DC
  Administered 2011-08-20 – 2011-08-21 (×2): 2 via ORAL

## 2011-08-20 MED ORDER — METHYLERGONOVINE MALEATE 0.2 MG/ML IJ SOLN: 0.2000 mg | INTRAMUSCULAR | Status: DC | PRN

## 2011-08-20 MED ORDER — BENZOCAINE-MENTHOL 20-0.5 % EX AERO: 1.0000 "application " | INHALATION_SPRAY | CUTANEOUS | Status: DC | PRN

## 2011-08-20 MED ORDER — METHYLERGONOVINE MALEATE 0.2 MG PO TABS: 0.2000 mg | ORAL_TABLET | ORAL | Status: DC | PRN

## 2011-08-20 MED ORDER — SIMETHICONE 80 MG PO CHEW: 80.0000 mg | CHEWABLE_TABLET | ORAL | Status: DC | PRN

## 2011-08-20 MED ORDER — MAGNESIUM HYDROXIDE 400 MG/5ML PO SUSP: 30.0000 mL | ORAL | Status: DC | PRN

## 2011-08-20 MED ORDER — ONDANSETRON HCL 4 MG/2ML IJ SOLN: 4.0000 mg | INTRAMUSCULAR | Status: DC | PRN

## 2011-08-20 MED ORDER — DIPHENHYDRAMINE HCL 25 MG PO CAPS: 25.0000 mg | ORAL_CAPSULE | Freq: Four times a day (QID) | ORAL | Status: DC | PRN

## 2011-08-20 MED ORDER — ZOLPIDEM TARTRATE 5 MG PO TABS: 5.0000 mg | ORAL_TABLET | Freq: Every evening | ORAL | Status: DC | PRN

## 2011-08-20 MED ORDER — TETANUS-DIPHTH-ACELL PERTUSSIS 5-2.5-18.5 LF-MCG/0.5 IM SUSP
0.5000 mL | Freq: Once | INTRAMUSCULAR | Status: AC
  Administered 2011-08-21: 0.5 mL via INTRAMUSCULAR
  Filled 2011-08-20: qty 0.5

## 2011-08-20 NOTE — Anesthesia Postprocedure Evaluation (Signed)
  Anesthesia Post-op Note  Patient: [name]  Procedure(s) Performed: * No procedures listed *  Patient Location: PACU and Mother/Baby  Anesthesia Type: Epidural  Level of Consciousness: awake, alert  and oriented  Airway and Oxygen Therapy: Patient Spontanous Breathing  Post-op Pain: mild  Post-op Assessment: Patient's Cardiovascular Status Stable and Respiratory Function Stable,no residual numbness or headache  Post-op Vital Signs: stable  Complications: No apparent anesthesia complications

## 2011-08-20 NOTE — Progress Notes (Signed)
UR chart review completed.  

## 2011-08-20 NOTE — Anesthesia Postprocedure Evaluation (Signed)
Anesthesia Post Note  Patient: [name]  Procedure(s) Performed: * No procedures listed *  Anesthesia type: Epidural  Patient location: Mother/Baby  Post pain: Pain level controlled  Post assessment: Post-op Vital signs reviewed  Last Vitals:  Filed Vitals:   08/20/11 0650  BP: 134/81  Pulse: 101  Temp: 36.7 C  Resp: 18    Post vital signs: Reviewed  Level of consciousness: awake  Complications: No apparent anesthesia complications

## 2011-08-20 NOTE — Addendum Note (Signed)
Addendum  created 08/20/11 0746 by Truitt Leep, CRNA   Modules edited:Charges VN, Notes Section

## 2011-08-20 NOTE — Progress Notes (Addendum)
PPD #0 No problems Afeb, VSS Fundus firm, NT at U-0 Continue routine postpartum care 

## 2011-08-21 MED ORDER — OXYCODONE-ACETAMINOPHEN 5-325 MG PO TABS: 1.0000 | ORAL_TABLET | ORAL | Status: AC | PRN

## 2011-08-21 NOTE — Discharge Summary (Signed)
Obstetric Discharge Summary Reason for Admission: induction of labor Prenatal Procedures: none Intrapartum Procedures: spontaneous vaginal delivery Postpartum Procedures: none Complications-Operative and Postpartum: none Hemoglobin  Date Value Range Status  08/20/2011 10.3* 12.0-15.0 (g/dL) Final     HCT  Date Value Range Status  08/20/2011 30.6* 36.0-46.0 (%) Final    Discharge Diagnoses: Term Pregnancy-delivered and gestational hypertension  Discharge Information: Date: 08/21/2011 Activity: pelvic rest Diet: routine Medications: Ibuprofen and Percocet Condition: stable Instructions: refer to practice specific booklet Discharge to: home Follow-up Information    Follow up with Orvill Coulthard D, MD. Schedule an appointment as soon as possible for a visit in 6 weeks. (Call ASAP to schedule circumcision)    Contact information:   [address] [place], [address] [place] [address] [phone]          Newborn Data: Live born female  Birth Weight: 8 lb 10 oz (3912 g) APGAR: 8, 9  Home with mother.  Chad Tiznado D 08/21/2011, 8:34 AM

## 2011-08-21 NOTE — Discharge Instructions (Signed)
As per discharge pamphlet °

## 2011-08-21 NOTE — Progress Notes (Signed)
PPD #1 No problems Afeb, VSS D/c home

## 2011-08-22 NOTE — Progress Notes (Signed)
PPD #2 No problems, did not go home yesterday because baby could not go Afeb, VSS D/c home

## 2011-09-30 ENCOUNTER — Encounter (HOSPITAL_COMMUNITY): Payer: Self-pay | Admitting: *Deleted

## 2011-09-30 ENCOUNTER — Inpatient Hospital Stay (HOSPITAL_COMMUNITY)
Admission: AD | Admit: 2011-09-30 | Discharge: 2011-09-30 | Disposition: A | Discharge status: Hospice | Payer: Medicaid Other | Source: Ambulatory Visit | Attending: Obstetrics and Gynecology | Admitting: Obstetrics and Gynecology

## 2011-09-30 DIAGNOSIS — R221 Localized swelling, mass and lump, neck: Secondary | ICD-10-CM | POA: Insufficient documentation

## 2011-09-30 DIAGNOSIS — R220 Localized swelling, mass and lump, head: Secondary | ICD-10-CM | POA: Insufficient documentation

## 2011-09-30 DIAGNOSIS — K047 Periapical abscess without sinus: Secondary | ICD-10-CM | POA: Insufficient documentation

## 2011-09-30 DIAGNOSIS — O99893 Other specified diseases and conditions complicating puerperium: Secondary | ICD-10-CM | POA: Insufficient documentation

## 2011-09-30 MED ORDER — DEXTROSE 5 % IV SOLN
1.0000 g | Freq: Once | INTRAVENOUS | Status: AC
  Administered 2011-09-30: 1 g via INTRAVENOUS
  Filled 2011-09-30: qty 10

## 2011-09-30 MED ORDER — AMOXICILLIN-POT CLAVULANATE 875-125 MG PO TABS: 1.0000 | ORAL_TABLET | Freq: Two times a day (BID) | ORAL | Status: AC

## 2011-09-30 MED ORDER — SODIUM CHLORIDE 0.9 % IV SOLN
INTRAVENOUS | Status: DC
  Administered 2011-09-30: 22:00:00 via INTRAVENOUS

## 2011-09-30 MED ORDER — HYDROMORPHONE HCL PF 1 MG/ML IJ SOLN
1.0000 mg | Freq: Once | INTRAMUSCULAR | Status: AC
  Administered 2011-09-30: 1 mg via INTRAVENOUS
  Filled 2011-09-30: qty 1

## 2011-09-30 MED ORDER — OXYCODONE-ACETAMINOPHEN 7.5-325 MG PO TABS: 1.0000 | ORAL_TABLET | ORAL | Status: AC | PRN

## 2011-09-30 MED ORDER — LACTATED RINGERS IV SOLN: INTRAVENOUS | Status: DC

## 2011-09-30 MED ORDER — ONDANSETRON HCL 4 MG/2ML IJ SOLN
4.0000 mg | Freq: Once | INTRAMUSCULAR | Status: AC
  Administered 2011-09-30: 4 mg via INTRAVENOUS
  Filled 2011-09-30: qty 2

## 2011-09-30 NOTE — Discharge Instructions (Signed)
CALL YOUR DENTIST IN THE MORNING AND TELL HIM YOU WERE HERE AND NEED A FOLLOW UP APPOINTMENT. IF YOUR SYMPTOMS WORSEN CALL YOU DOCTOR OR GO TO  ED.  Abscessed Tooth A tooth abscess is a collection of infected fluid (pus) from a bacterial infection in the inner part of the tooth (pulp). It usually occurs at the end of the tooth's root.  CAUSES   A very bad cavity (extensive tooth decay).   Trauma to the tooth, such as a broken or chipped tooth, that allows bacteria to enter into the pulp.  SYMPTOMS  Severe pain in and around the infected tooth.   Swelling and redness around the abscessed tooth or in the mouth or face.   Tenderness.   Pus drainage.   Bad breath.   Bitter taste in the mouth.   Difficulty swallowing.   Difficulty opening the mouth.   Feeling sick to your stomach (nauseous).   Vomiting.   Chills.   Swollen neck glands.  DIAGNOSIS  A medical and dental history will be taken.   An examination will be performed by tapping on the abscessed tooth.   X-rays may be taken of the tooth to identify the abscess.  TREATMENT The goal of treatment is to eliminate the infection.   You may be prescribed antibiotic medicine to stop the infection from spreading.   A root canal may be performed to save the tooth. If the tooth cannot be saved, it may be pulled (extracted) and the abscess may be drained.  HOME CARE INSTRUCTIONS  Only take over-the-counter or prescription medicines for pain, fever, or discomfort as directed by your caregiver.   Do not drive after taking pain medicine (narcotics).   Rinse your mouth (gargle) often with salt water ( tsp salt in 8 oz of warm water) to relieve pain or swelling.   Do not apply heat to the outside of your face.   Return to your dentist for further treatment as directed.  SEEK IMMEDIATE DENTAL CARE IF:  You have a temperature by mouth above 102 F (38.9 C), not controlled by medicine.   You have chills or a  very bad headache.   You have problems breathing or swallowing.   Your have trouble opening your mouth.   You develop swelling in the neck or around the eye.   Your pain is not helped by medicine.   Your pain is getting worse instead of better.  Document Released: 06/10/2005 Document Revised: 05/30/2011 Document Reviewed: 09/18/2010 [facility] Patient Information 2012 [place], [place].

## 2011-09-30 NOTE — MAU Provider Note (Signed)
History     CSN: [id number]  Arrival date & time 09/30/11  2012   None     No chief complaint on file.   HPI [name] is a 26 y.o. female who presents to MAU for swelling of her face that started yesterday. She had problems with infected teeth during her pregnancy but was treated with antibiotics and got better. Headache and facial pain is severe and radiates to left ear.  Left upper wisdom tooth is cracked and painful, first molar upper left swollen and tender. Denies fever or other problems. The history was provided by the patient.  Past Medical History  Diagnosis Date  . Pregnancy induced hypertension     Past Surgical History  Procedure Date  . No past surgeries     Family History  Problem Relation Age of Onset  . Asthma Mother   . Diabetes Maternal Grandmother   . Hypertension Maternal Grandmother   . Asthma Maternal Grandfather   . Anesthesia problems Neg Hx     History  Substance Use Topics  . Smoking status: Never Smoker   . Smokeless tobacco: Never Used  . Alcohol Use: No    OB History    Grav Para Term Preterm Abortions TAB SAB Ect Mult Living   2 2 2  0 0 0 0 0 0 2      Review of Systems  Constitutional: Negative for fever and chills.  HENT: Positive for ear pain and dental problem. Negative for congestion and neck pain.   Respiratory: Negative.   Cardiovascular: Negative.   Gastrointestinal: Negative.   Genitourinary: Negative.   Neurological: Positive for headaches.  Psychiatric/Behavioral: Negative for confusion and agitation. The patient is nervous/anxious.     Allergies  Latex  Home Medications  No current outpatient prescriptions on file.  BP 146/86  Pulse 66  Temp(Src) 98.5 F (36.9 C) (Oral)  Resp 16  Breastfeeding? Unknown  Physical Exam  Nursing note and vitals reviewed. Constitutional: She is oriented to person, place, and time. She appears well-developed and well-nourished. No distress.       Appears uncomfortable.    HENT:  Head:    Mouth/Throat: Uvula is midline and oropharynx is clear and moist. Dental abscesses and dental caries present.         Swelling and tenderness with palpation left side of face.  Eyes: EOM are normal.  Neck: Neck supple.  Pulmonary/Chest: Effort normal.  Abdominal: Soft. There is no tenderness.  Musculoskeletal: Normal range of motion.  Neurological: She is alert and oriented to person, place, and time. No cranial nerve deficit.  Skin: Skin is warm and dry.  Psychiatric: Her behavior is normal. Judgment and thought content normal. Her mood appears anxious.   Assessment: Dental Abscess with facial swelling  Plan:  Rocephin 1 gram IV   Dilaudid 1 mg IV   Zofran 4 mg IV   Rx Augmentin 875 mg bid   Rx percocet    Dental follow up (patient has a dentist and will call in am)  ED Course: 21:45 Discussed with Dr. Senaida Ores and she agrees with plan of care.   Procedures    MDM: Patient feeling better after pain medication and antibiotics.

## 2011-09-30 NOTE — MAU Note (Signed)
Pt had a tooth abcess during her pregnancy and was prescribed antibx.  He tooth flared up again last night with pain and swelling in the left side of her face.

## 2012-01-24 ENCOUNTER — Encounter (HOSPITAL_COMMUNITY): Payer: Self-pay | Admitting: *Deleted

## 2012-01-24 ENCOUNTER — Emergency Department (HOSPITAL_COMMUNITY)
Admission: EM | Admit: 2012-01-24 | Discharge: 2012-01-24 | Disposition: A | Discharge status: Home / Self Care | Payer: Medicaid Other | Attending: Emergency Medicine | Admitting: Emergency Medicine

## 2012-01-24 DIAGNOSIS — L0291 Cutaneous abscess, unspecified: Secondary | ICD-10-CM

## 2012-01-24 DIAGNOSIS — IMO0002 Reserved for concepts with insufficient information to code with codable children: Secondary | ICD-10-CM | POA: Insufficient documentation

## 2012-01-24 MED ORDER — LIDOCAINE-EPINEPHRINE-TETRACAINE (LET) SOLUTION
3.0000 mL | Freq: Once | NASAL | Status: AC
  Administered 2012-01-24: 3 mL via TOPICAL
  Filled 2012-01-24: qty 3

## 2012-01-24 MED ORDER — IBUPROFEN 200 MG PO TABS
600.0000 mg | ORAL_TABLET | Freq: Once | ORAL | Status: AC
  Administered 2012-01-24: 600 mg via ORAL
  Filled 2012-01-24: qty 3

## 2012-01-24 MED ORDER — OXYCODONE-ACETAMINOPHEN 5-325 MG PO TABS
1.0000 | ORAL_TABLET | Freq: Once | ORAL | Status: AC
  Administered 2012-01-24: 1 via ORAL
  Filled 2012-01-24: qty 1

## 2012-01-24 MED ORDER — OXYCODONE-ACETAMINOPHEN 5-325 MG PO TABS: 1.0000 | ORAL_TABLET | ORAL | Status: AC | PRN

## 2012-01-24 MED ORDER — LIDOCAINE HCL 2 % IJ SOLN
10.0000 mL | Freq: Once | INTRAMUSCULAR | Status: AC
  Administered 2012-01-24: 200 mg via INTRADERMAL
  Filled 2012-01-24: qty 1

## 2012-01-24 NOTE — ED Provider Notes (Signed)
History     CSN: [id number]  Arrival date & time 01/24/12  1743   First MD Initiated Contact with Patient 01/24/12 2018      Chief Complaint  Patient presents with  . Abscess    (Consider location/radiation/quality/duration/timing/severity/associated sxs/prior treatment) Patient is a 26 y.o. female presenting with abscess. The history is provided by the patient. No language interpreter was used.  Abscess  This is a recurrent problem. The current episode started less than one week ago. The problem has been gradually worsening. Affected Location: R axilla. The problem is moderate. The abscess is characterized by redness, painfulness and swelling. The abscess first occurred at home. Pertinent negatives include no fever and no vomiting.  R axilla abscess x 5-7 days.  pmh of the same but has never had to I & D.  Afebrile.  Has a 94 month old and she is breast feeding.  Non toxic appearance.   Past Medical History  Diagnosis Date  . Pregnancy induced hypertension     Past Surgical History  Procedure Date  . No past surgeries     Family History  Problem Relation Age of Onset  . Asthma Mother   . Diabetes Maternal Grandmother   . Hypertension Maternal Grandmother   . Asthma Maternal Grandfather   . Anesthesia problems Neg Hx     History  Substance Use Topics  . Smoking status: Never Smoker   . Smokeless tobacco: Never Used  . Alcohol Use: No    OB History    Grav Para Term Preterm Abortions TAB SAB Ect Mult Living   2 2 2  0 0 0 0 0 0 2      Review of Systems  Constitutional: Negative for fever and diaphoresis.  Respiratory: Negative for shortness of breath.   Gastrointestinal: Negative for nausea and vomiting.  Skin:       R axilla abscess  Neurological: Negative for dizziness, weakness and light-headedness.    Allergies  Latex  Home Medications   Current Outpatient Rx  Name Route Sig Dispense Refill  . IBUPROFEN 200 MG PO TABS Oral Take 200 mg by mouth  every 6 (six) hours as needed. For pain    . PRENATAL MULTIVITAMIN CH Oral Take 1 tablet by mouth daily.      BP 147/84  Pulse 88  Temp 98.9 F (37.2 C) (Oral)  Resp 18  SpO2 100%  Physical Exam  Nursing note and vitals reviewed. Constitutional: She is oriented to person, place, and time. She appears well-developed and well-nourished. No distress.  HENT:  Head: Normocephalic and atraumatic.  Eyes: Conjunctivae and EOM are normal. Pupils are equal, round, and reactive to light.  Neck: Normal range of motion. Neck supple.  Cardiovascular: Normal rate, regular rhythm, normal heart sounds and intact distal pulses.  Exam reveals no gallop and no friction rub.   No murmur heard. Pulmonary/Chest: Effort normal and breath sounds normal.  Abdominal: Soft. Bowel sounds are normal.  Musculoskeletal: Normal range of motion.  Neurological: She is alert and oriented to person, place, and time. She has normal reflexes.  Skin: Skin is warm and dry.       R axillia tenderness  6-7 cm abscess no cellulitis  Psychiatric: She has a normal mood and affect.    ED Course  INCISION AND DRAINAGE Date/Time: 01/24/2012 9:11 PM Performed by: Remi Haggard Authorized by: Remi Haggard Consent: Verbal consent obtained. Written consent not obtained. Risks and benefits: risks, benefits and alternatives were discussed Consent  given by: patient Patient understanding: patient states understanding of the procedure being performed Patient identity confirmed: verbally with patient, arm band, provided demographic data and hospital-assigned identification number Time out: Immediately prior to procedure a "time out" was called to verify the correct patient, procedure, equipment, support staff and site/side marked as required. Type: abscess Body area: trunk (r axilla) Anesthesia: local infiltration Local anesthetic: lidocaine 2% without epinephrine Anesthetic total: 6 ml Patient sedated: no Scalpel size:  11 Needle gauge: 22 Incision type: single straight Complexity: simple Drainage: purulent and serosanguinous Drainage amount: moderate Wound treatment: wound left open Packing material: 1/4 in iodoform gauze Patient tolerance: Patient tolerated the procedure well with no immediate complications.   (including critical care time)  Labs Reviewed - No data to display No results found.   No diagnosis found.    MDM  r axilla abscess I & D.  No antibiotics.  Pt is breastfeeding.  Will follow up in 2 days for packing removal.  Afebrile .  Non toxic appearance.         Remi Haggard, NP 01/25/12 1413

## 2012-01-24 NOTE — ED Notes (Signed)
Pt states she has a 2 boils under her right arm. Pt states it is painful to move her arm. Pt denies any drainage

## 2012-01-25 NOTE — ED Provider Notes (Signed)
History/physical exam/procedure(s) were performed by non-physician practitioner and as supervising physician I was immediately available for consultation/collaboration. I have reviewed all notes and am in agreement with care and plan.   Maedell Hedger S Jeslie Lowe, MD 01/25/12 1531 

## 2012-06-24 NOTE — L&D Delivery Note (Signed)
Delivery Note Pt entered active labor, progressed to complete and pushed well.  At 3:19 AM a viable female was delivered via Vaginal, Spontaneous Delivery (Presentation: Right Occiput Anterior).  APGAR: 6, 9; weight pending.   Placenta status: Intact, Spontaneous.  Cord: 3 vessels with the following complications: None.  I never saw amniotic fluid prior to delivery, only a small amount of meconium stained fluid came out with the baby.  Anesthesia: Epidural  Episiotomy: None Lacerations: None Suture Repair: none Est. Blood Loss (mL): 300  Mom to postpartum.  Baby to stay with mom.  Arye Weyenberg D 01/08/2013, 3:36 AM

## 2012-06-27 ENCOUNTER — Inpatient Hospital Stay (HOSPITAL_COMMUNITY)
Admission: AD | Admit: 2012-06-27 | Discharge: 2012-06-27 | Disposition: A | Discharge status: Home / Self Care | Payer: Medicaid Other | Source: Ambulatory Visit | Attending: Obstetrics and Gynecology | Admitting: Obstetrics and Gynecology

## 2012-06-27 ENCOUNTER — Encounter (HOSPITAL_COMMUNITY): Payer: Self-pay | Admitting: *Deleted

## 2012-06-27 DIAGNOSIS — L509 Urticaria, unspecified: Secondary | ICD-10-CM | POA: Insufficient documentation

## 2012-06-27 DIAGNOSIS — Z3490 Encounter for supervision of normal pregnancy, unspecified, unspecified trimester: Secondary | ICD-10-CM

## 2012-06-27 DIAGNOSIS — R51 Headache: Secondary | ICD-10-CM | POA: Insufficient documentation

## 2012-06-27 DIAGNOSIS — O99891 Other specified diseases and conditions complicating pregnancy: Secondary | ICD-10-CM | POA: Insufficient documentation

## 2012-06-27 DIAGNOSIS — L508 Other urticaria: Secondary | ICD-10-CM

## 2012-06-27 DIAGNOSIS — R109 Unspecified abdominal pain: Secondary | ICD-10-CM | POA: Insufficient documentation

## 2012-06-27 DIAGNOSIS — K089 Disorder of teeth and supporting structures, unspecified: Secondary | ICD-10-CM | POA: Insufficient documentation

## 2012-06-27 DIAGNOSIS — K0889 Other specified disorders of teeth and supporting structures: Secondary | ICD-10-CM

## 2012-06-27 LAB — URINALYSIS, ROUTINE W REFLEX MICROSCOPIC
Glucose, UA: NEGATIVE mg/dL
Hgb urine dipstick: NEGATIVE
Leukocytes, UA: NEGATIVE
Nitrite: NEGATIVE
Protein, ur: NEGATIVE mg/dL
Specific Gravity, Urine: <1.005 — ABNORMAL LOW (ref 1.005–1.030)
pH: 6.5 (ref 5.0–8.0)

## 2012-06-27 NOTE — MAU Note (Signed)
Something on R side of my mouth burst and now it's bleeding but I'm hurting on the L side. I don't know what's going on with my mouth. Have headache. Cramping for last month or so. Hives all over my body since I delivered in February.

## 2012-06-27 NOTE — MAU Provider Note (Signed)
Chief Complaint: Dental Pain, Headache, Abdominal Cramping and Urticaria   First Provider Initiated Contact with Patient 06/27/12 [phone]     SUBJECTIVE HPI: [name] is a 27 y.o. G3P2002 at [redacted]w[redacted]d by LMP who presents to maternity admissions reporting toothache, daily episodes of hives x6 months, and possible early pregnancy.  She reports some sharp inguinal pain described as mild and only occuring when she moves or changes position. She reports that something in her mouth burst open a few hours ago on the right side, and now she has pain on the left.  The pain has started to subside since her arrival to MAU.   She had tooth abscesses in her last pregnancy.  Patient's last menstrual period was 03/29/2012.  She denies LOF, vaginal bleeding, vaginal itching/burning, urinary symptoms, h/a, dizziness, n/v, or fever/chills.     Past Medical History  Diagnosis Date  . Pregnancy induced hypertension    Past Surgical History  Procedure Date  . No past surgeries    History   Social History  . Marital Status: Single    Spouse Name: N/A    Number of Children: N/A  . Years of Education: N/A   Occupational History  . Not on file.   Social History Main Topics  . Smoking status: Never Smoker   . Smokeless tobacco: Never Used  . Alcohol Use: No  . Drug Use: No  . Sexually Active: Yes    Birth Control/ Protection: None     Comment: Just stopped breast feeding   Other Topics Concern  . Not on file   Social History Narrative  . No narrative on file   No current facility-administered medications on file prior to encounter.   Current Outpatient Prescriptions on File Prior to Encounter  Medication Sig Dispense Refill  . Prenatal Vit-Fe Fumarate-FA (PRENATAL MULTIVITAMIN) TABS Take 1 tablet by mouth daily.       Allergies  Allergen Reactions  . Latex Itching and Rash    ROS: Pertinent items in HPI  OBJECTIVE BP 145/84  Pulse 83  Temp 98.6 F (37 C) (Oral)  Resp 20  Ht 5\' 3"   (1.6 m)  Wt 75.388 kg (166 lb 3.2 oz)  BMI 29.44 kg/m2  LMP 03/29/2012  Breastfeeding? Unknown Patient Vitals for the past 24 hrs:  BP Temp Temp src Pulse Resp Height Weight  06/27/12 0523 145/84 mmHg 98.6 F (37 C) Oral 83  20  - -  06/27/12 0416 140/86 mmHg 98.2 F (36.8 C) - 90  20  5\' 3"  (1.6 m) 75.388 kg (166 lb 3.2 oz)   GENERAL: Well-developed, well-nourished female in no acute distress.  HEENT: Normocephalic Mouth - mucous membranes moist, pharynx normal without lesions, dental hygiene good and no erythema, edema, or drainage present in gum region where pt reports pain Neck - supple, no significant adenopathy, thyroid exam: thyroid is normal in size without nodules or tenderness HEART: normal rate RESP: normal effort ABDOMEN: Soft, non-tender EXTREMITIES: Nontender, no edema NEURO: Alert and oriented SPECULUM EXAM: Deferred  FHT 167 by doppler Fundal height 1cm above symphysis pubis via external abdominal exam  LAB RESULTS Results for orders placed during the hospital encounter of 06/27/12 (from the past 24 hour(s))  URINALYSIS, ROUTINE W REFLEX MICROSCOPIC     Status: Abnormal   Collection Time   06/27/12  4:30 AM      Component Value Range   Color, Urine YELLOW  YELLOW   APPearance CLEAR  CLEAR   Specific  Gravity, Urine <1.005 (*) 1.005 - 1.030   pH 6.5  5.0 - 8.0   Glucose, UA NEGATIVE  NEGATIVE mg/dL   Hgb urine dipstick NEGATIVE  NEGATIVE   Bilirubin Urine NEGATIVE  NEGATIVE   Ketones, ur NEGATIVE  NEGATIVE mg/dL   Protein, ur NEGATIVE  NEGATIVE mg/dL   Urobilinogen, UA 0.2  0.0 - 1.0 mg/dL   Nitrite NEGATIVE  NEGATIVE   Leukocytes, UA NEGATIVE  NEGATIVE  POCT PREGNANCY, URINE     Status: Abnormal   Collection Time   06/27/12  4:32 AM      Component Value Range   Preg Test, Ur POSITIVE (*) NEGATIVE    ASSESSMENT 1. Toothache   2. Normal IUP (intrauterine pregnancy) on prenatal ultrasound   3. Urticaria, chronic     PLAN Discharge home Pt to call Dr  Jackelyn Knife on Monday to make initial prenatal appointment Encouraged pt to keep log of triggers for hives throughout the day Pt taking intermittent Zyrtec and Benadryl, recommend daily Zyrtec, continue Benadryl PRN Tylenol for pain Return to MAU as needed    Medication List     As of 06/27/2012  5:33 AM    STOP taking these medications         ibuprofen 200 MG tablet   Commonly known as: ADVIL,MOTRIN      TAKE these medications         acetaminophen 325 MG tablet   Commonly known as: TYLENOL   Take 650 mg by mouth every 6 (six) hours as needed.      prenatal multivitamin Tabs   Take 1 tablet by mouth daily.           Follow-up Information    Schedule an appointment as soon as possible for a visit with MEISINGER,TODD D, MD. (Return to MAU as needed.)    Contact information:   [address] [place] [address] [phone]          Sharen Counter Certified Nurse-Midwife 06/27/2012  5:33 AM

## 2012-06-27 NOTE — Progress Notes (Signed)
Written and verbal d/c instructions given and understanding voiced. 

## 2012-07-30 ENCOUNTER — Inpatient Hospital Stay (HOSPITAL_COMMUNITY)
Admission: AD | Admit: 2012-07-30 | Discharge: 2012-07-30 | Disposition: A | Discharge status: Home / Self Care | Payer: Self-pay | Source: Ambulatory Visit | Attending: Obstetrics and Gynecology | Admitting: Obstetrics and Gynecology

## 2012-07-30 ENCOUNTER — Encounter (HOSPITAL_COMMUNITY): Payer: Self-pay | Admitting: *Deleted

## 2012-07-30 DIAGNOSIS — K458 Other specified abdominal hernia without obstruction or gangrene: Secondary | ICD-10-CM | POA: Insufficient documentation

## 2012-07-30 DIAGNOSIS — K469 Unspecified abdominal hernia without obstruction or gangrene: Secondary | ICD-10-CM

## 2012-07-30 DIAGNOSIS — R109 Unspecified abdominal pain: Secondary | ICD-10-CM | POA: Insufficient documentation

## 2012-07-30 NOTE — Progress Notes (Signed)
Pt states pain gets worse when she eats

## 2012-07-30 NOTE — MAU Note (Signed)
Hernia is causing more pain, hurts worse when she eats.painful and can't eat as much as usual.  Hurting more than with last preg.  Tender to touch.

## 2012-07-30 NOTE — MAU Provider Note (Signed)
Chief Complaint: Abdominal Pain   First Provider Initiated Contact with Patient 07/30/12 1528     SUBJECTIVE HPI: [name] is a 27 y.o. G3P2002 at [redacted]w[redacted]d by LMP who presents with 1-2 day hx of worsening pain from abdominal hernia. She reports intermittent pain x 1 month but recently having sharp pain  And pulling sensation at site of hernia every time she eats. States there is bulging and firmness when she eats and she not able to eat much at a time. No N/V/D/C. Last BM last night. Aware of bulge since after first delivery in 2008.  NPC yet; has applied for [facility] and plans care with Dr. Jackelyn Knife who did last delivery 1 year ago.   Past Medical History  Diagnosis Date  . Pregnancy induced hypertension    OB History    Grav Para Term Preterm Abortions TAB SAB Ect Mult Living   3 2 2  0 0 0 0 0 0 2     # Outc Date GA Lbr Len/2nd Wgt Sex Del Anes PTL Lv   1 TRM 2/13 [redacted]w[redacted]d 05:40 / 00:24 8lb10oz(3.912kg) M SVD EPI  Yes   Comments: WDL   2 TRM      SVD   Yes   3 CUR              Past Surgical History  Procedure Date  . No past surgeries    History   Social History  . Marital Status: Single    Spouse Name: N/A    Number of Children: N/A  . Years of Education: N/A   Occupational History  . Not on file.   Social History Main Topics  . Smoking status: Never Smoker   . Smokeless tobacco: Never Used  . Alcohol Use: No  . Drug Use: No  . Sexually Active: Yes    Birth Control/ Protection: None     Comment: Just stopped breast feeding   Other Topics Concern  . Not on file   Social History Narrative  . No narrative on file   No current facility-administered medications on file prior to encounter.   Current Outpatient Prescriptions on File Prior to Encounter  Medication Sig Dispense Refill  . acetaminophen (TYLENOL) 325 MG tablet Take 650 mg by mouth every 6 (six) hours as needed.       Allergies  Allergen Reactions  . Latex Itching and Rash    ROS: Pertinent items in  HPI  OBJECTIVE Blood pressure 131/68, pulse 90, temperature 98.6 F (37 C), temperature source Oral, resp. rate 18, height 5\' 3"  (1.6 m), weight 168 lb (76.204 kg), last menstrual period 03/29/2012. GENERAL: Well-developed, well-nourished female in no acute distress.  HEENT: Normocephalic HEART: normal rate RESP: normal effort ABDOMEN: Soft, BS present. Diastasis rectus about 6 cm with soft bulge above umbilicus only on abd tightening. Minimally TTP. S=D;  FHR 156 EXTREMITIES: Nontender, no edema NEURO: Alert and oriented  LAB RESULTS No results found for this or any previous visit (from the past 24 hour(s)).  MAU COURSE Pt left MAU to pick up her children before Dr. Jackelyn Knife available for consult. She declined analgesia here or prescription  ASSESSMENT 1. Abdominal hernia   Hernia without signs of bowel entrapment but concerning due to pain pattern 27 yo G3P2002 at [redacted]w[redacted]d, [facility]  PLAN TC Dr. Jackelyn Knife by mistake. C/W after pt left w/  Dr. Ambrose Mantle who agrees with plan. Advised pt that hernia needs specialty evaluation due to increased and persistent pain  and to go to [facility] if pain continues.   Medication List     As of 07/30/2012  4:11 PM    TAKE these medications         acetaminophen 325 MG tablet   Commonly known as: TYLENOL   Take 650 mg by mouth every 6 (six) hours as needed.      multivitamin with minerals Tabs   Take 1 tablet by mouth daily.          Danae Orleans, CNM 07/30/2012  3:49 PM

## 2012-07-30 NOTE — Progress Notes (Signed)
Pt states she also suffers with hemrrhoids as well

## 2012-07-30 NOTE — MAU Note (Signed)
Pt states she has been having pain for the past month. Pt states she had the hernia for about 5 years. Pt states she also is feeling full

## 2012-10-08 LAB — OB RESULTS CONSOLE GC/CHLAMYDIA: Gonorrhea: NEGATIVE

## 2012-10-08 LAB — OB RESULTS CONSOLE RPR: RPR: NONREACTIVE

## 2012-10-08 LAB — OB RESULTS CONSOLE HIV ANTIBODY (ROUTINE TESTING): HIV: NONREACTIVE

## 2012-10-08 LAB — OB RESULTS CONSOLE RUBELLA ANTIBODY, IGM: Rubella: IMMUNE

## 2012-11-13 ENCOUNTER — Encounter (HOSPITAL_COMMUNITY): Payer: Self-pay | Admitting: *Deleted

## 2012-11-13 ENCOUNTER — Inpatient Hospital Stay (HOSPITAL_COMMUNITY)
Admission: AD | Admit: 2012-11-13 | Discharge: 2012-11-13 | Disposition: A | Discharge status: Home / Self Care | Payer: 59 | Source: Ambulatory Visit | Attending: Obstetrics and Gynecology | Admitting: Obstetrics and Gynecology

## 2012-11-13 DIAGNOSIS — R109 Unspecified abdominal pain: Secondary | ICD-10-CM | POA: Insufficient documentation

## 2012-11-13 DIAGNOSIS — O99891 Other specified diseases and conditions complicating pregnancy: Secondary | ICD-10-CM | POA: Insufficient documentation

## 2012-11-13 DIAGNOSIS — M549 Dorsalgia, unspecified: Secondary | ICD-10-CM

## 2012-11-13 LAB — URINALYSIS, ROUTINE W REFLEX MICROSCOPIC
Bilirubin Urine: NEGATIVE
Glucose, UA: NEGATIVE mg/dL
Hgb urine dipstick: NEGATIVE
Protein, ur: NEGATIVE mg/dL
Specific Gravity, Urine: <1.005 — ABNORMAL LOW (ref 1.005–1.030)
Urobilinogen, UA: 0.2 mg/dL (ref 0.0–1.0)

## 2012-11-13 MED ORDER — ACETAMINOPHEN 500 MG PO TABS
1000.0000 mg | ORAL_TABLET | Freq: Once | ORAL | Status: AC
  Administered 2012-11-13: 1000 mg via ORAL
  Filled 2012-11-13: qty 2

## 2012-11-13 NOTE — MAU Provider Note (Signed)
History     CSN: [id number]  Arrival date and time: 11/13/12 4132   None     Chief Complaint  Patient presents with  . Abdominal Pain   HPI  [name] is a 27 y.o. G3P2002 at [redacted]w[redacted]d who presents tonight with back pain and abdominal pain that comes and goes. She states that the pain is up at the top of her stomach, and in the middle to upper part of her back. She denies LOF and confirms fetal movement. She states that she last had intercourse about 2 days ago.   Past Medical History  Diagnosis Date  . Pregnancy induced hypertension     Past Surgical History  Procedure Laterality Date  . No past surgeries      Family History  Problem Relation Age of Onset  . Asthma Mother   . Diabetes Maternal Grandmother   . Hypertension Maternal Grandmother   . Asthma Maternal Grandfather   . Anesthesia problems Neg Hx     History  Substance Use Topics  . Smoking status: Never Smoker   . Smokeless tobacco: Never Used  . Alcohol Use: No    Allergies:  Allergies  Allergen Reactions  . Latex Itching and Rash    Prescriptions prior to admission  Medication Sig Dispense Refill  . acetaminophen (TYLENOL) 325 MG tablet Take 650 mg by mouth every 6 (six) hours as needed.      . Multiple Vitamin (MULTIVITAMIN WITH MINERALS) TABS Take 1 tablet by mouth daily.        Review of Systems  Constitutional: Negative for fever.  Eyes: Negative for blurred vision.  Respiratory: Negative for shortness of breath.   Cardiovascular: Negative for chest pain.  Gastrointestinal: Positive for abdominal pain. Negative for nausea, vomiting, diarrhea and constipation.  Genitourinary: Negative for dysuria, urgency and frequency.  Musculoskeletal: Negative for myalgias.  Neurological: Negative for headaches.   Physical Exam   Blood pressure 134/88, pulse 115, temperature 98.3 F (36.8 C), temperature source Oral, resp. rate 20, height 5\' 3"  (1.6 m), weight 85.73 kg (189 lb), last menstrual  period 03/29/2012, SpO2 99.00%.  Physical Exam  Nursing note and vitals reviewed. Constitutional: She is oriented to person, place, and time. She appears well-developed and well-nourished.  Cardiovascular: Normal rate.   Respiratory: Effort normal.  GI: Soft. There is no tenderness.  Genitourinary:  No CVA tenderness External: no lesion Vagina: scant amount of white discharge Cervix: closed/thick/high Uterus: AGA  Neurological: She is alert and oriented to person, place, and time.  Skin: Skin is warm and dry.  Psychiatric: She has a normal mood and affect.   FHT: CatI Toco: no UCs MAU Course  Procedures  Results for orders placed during the hospital encounter of 11/13/12 (from the past 24 hour(s))  URINALYSIS, ROUTINE W REFLEX MICROSCOPIC     Status: Abnormal   Collection Time    11/13/12 12:50 AM      Result Value Range   Color, Urine YELLOW  YELLOW   APPearance CLEAR  CLEAR   Specific Gravity, Urine <1.005 (*) 1.005 - 1.030   pH 6.5  5.0 - 8.0   Glucose, UA NEGATIVE  NEGATIVE mg/dL   Hgb urine dipstick NEGATIVE  NEGATIVE   Bilirubin Urine NEGATIVE  NEGATIVE   Ketones, ur NEGATIVE  NEGATIVE mg/dL   Protein, ur NEGATIVE  NEGATIVE mg/dL   Urobilinogen, UA 0.2  0.0 - 1.0 mg/dL   Nitrite NEGATIVE  NEGATIVE   Leukocytes, UA NEGATIVE  NEGATIVE    0145: Spoke with Dr. Senaida Ores, pt is ok for dc home.  Assessment and Plan   1. Back pain complicating pregnancy, third trimester    Comfort measures reviewed PTL danger signs reviewed FU with OB as scheduled.   Tawnya Crook 11/13/2012, 1:25 AM

## 2012-11-13 NOTE — MAU Note (Signed)
Pt states that since 2350 she has had pain in her left leg and lower back tht comes and goes

## 2012-11-13 NOTE — MAU Note (Signed)
Pt appears to be drowsey and relaxed but then will be restless for a few seconds and go back to relaxed and drowsey-states the front of her legs feel painful and she has to move them

## 2012-12-01 LAB — OB RESULTS CONSOLE GC/CHLAMYDIA
Chlamydia: NEGATIVE
Gonorrhea: NEGATIVE

## 2012-12-01 LAB — OB RESULTS CONSOLE GBS: GBS: NEGATIVE

## 2012-12-17 ENCOUNTER — Encounter (HOSPITAL_COMMUNITY): Payer: Self-pay | Admitting: *Deleted

## 2012-12-17 ENCOUNTER — Inpatient Hospital Stay (HOSPITAL_COMMUNITY)
Admission: AD | Admit: 2012-12-17 | Discharge: 2012-12-17 | Disposition: A | Discharge status: Home / Self Care | Payer: 59 | Source: Ambulatory Visit | Attending: Obstetrics and Gynecology | Admitting: Obstetrics and Gynecology

## 2012-12-17 DIAGNOSIS — G2581 Restless legs syndrome: Secondary | ICD-10-CM | POA: Insufficient documentation

## 2012-12-17 DIAGNOSIS — M549 Dorsalgia, unspecified: Secondary | ICD-10-CM | POA: Insufficient documentation

## 2012-12-17 DIAGNOSIS — M5430 Sciatica, unspecified side: Secondary | ICD-10-CM | POA: Insufficient documentation

## 2012-12-17 DIAGNOSIS — O99891 Other specified diseases and conditions complicating pregnancy: Secondary | ICD-10-CM | POA: Insufficient documentation

## 2012-12-17 DIAGNOSIS — N949 Unspecified condition associated with female genital organs and menstrual cycle: Secondary | ICD-10-CM | POA: Insufficient documentation

## 2012-12-17 DIAGNOSIS — M5432 Sciatica, left side: Secondary | ICD-10-CM

## 2012-12-17 HISTORY — DX: Anxiety disorder, unspecified: F41.9

## 2012-12-17 MED ORDER — OXYCODONE-ACETAMINOPHEN 5-325 MG PO TABS
1.0000 | ORAL_TABLET | Freq: Once | ORAL | Status: AC
  Administered 2012-12-17: 1 via ORAL
  Filled 2012-12-17: qty 1

## 2012-12-17 MED ORDER — CYCLOBENZAPRINE HCL 10 MG PO TABS: 10.0000 mg | ORAL_TABLET | Freq: Once | ORAL | Status: DC

## 2012-12-17 MED ORDER — ZOLPIDEM TARTRATE 5 MG PO TABS: 5.0000 mg | ORAL_TABLET | Freq: Every evening | ORAL | Status: DC | PRN

## 2012-12-17 MED ORDER — CYCLOBENZAPRINE HCL 10 MG PO TABS
10.0000 mg | ORAL_TABLET | Freq: Once | ORAL | Status: AC
  Administered 2012-12-17: 10 mg via ORAL
  Filled 2012-12-17: qty 1

## 2012-12-17 MED ORDER — OXYCODONE-ACETAMINOPHEN 5-325 MG PO TABS: 1.0000 | ORAL_TABLET | Freq: Once | ORAL | Status: DC

## 2012-12-17 NOTE — OB Triage Provider Note (Signed)
History     CSN: [id number]  Arrival date and time: 12/17/12 1058   None     No chief complaint on file.  HPI 27 y.o. [id number] at [redacted]w[redacted]d presenting for labor check. Labor ruled out by [employer]. Pt mentioned left leg pain and numbness in course of labor check, RN requested eval. Pain has been ongoing throughout latter part of pregnancy. Shooting pain from left lower back/hip all the way down left leg. Some numbess, but no loss of function or strength in leg. Takes tylenol and occasional aleve with no relief. Also c/o some restlessness in leg. This was relieved last night when she took an [place] for sleep.   Past Medical History  Diagnosis Date  . Pregnancy induced hypertension   . Anxiety     Past Surgical History  Procedure Laterality Date  . No past surgeries      Family History  Problem Relation Age of Onset  . Asthma Mother   . Diabetes Maternal Grandmother   . Hypertension Maternal Grandmother   . Asthma Maternal Grandfather   . Anesthesia problems Neg Hx     History  Substance Use Topics  . Smoking status: Never Smoker   . Smokeless tobacco: Never Used  . Alcohol Use: No    Allergies:  Allergies  Allergen Reactions  . Latex Itching and Rash    Prescriptions prior to admission  Medication Sig Dispense Refill  . acetaminophen (TYLENOL) 325 MG tablet Take 650 mg by mouth every 6 (six) hours as needed.      . Multiple Vitamin (MULTIVITAMIN WITH MINERALS) TABS Take 1 tablet by mouth daily.        Review of Systems  Constitutional: Positive for malaise/fatigue.  Respiratory: Negative.   Cardiovascular: Negative.   Gastrointestinal: Negative for nausea, vomiting, abdominal pain, diarrhea and constipation.  Genitourinary: Negative for dysuria, urgency, frequency, hematuria and flank pain.       Negative for vaginal bleeding,  + cramping/contractions  Musculoskeletal: Positive for back pain and joint pain.  Neurological: Positive for sensory change. Negative for  tingling and focal weakness.  Psychiatric/Behavioral: Negative.    Physical Exam   Blood pressure 138/78, pulse 115, height 5\' 3"  (1.6 m), weight 195 lb (88.451 kg), last menstrual period 03/29/2012.  Physical Exam  Nursing note and vitals reviewed. Constitutional: She is oriented to person, place, and time. She appears well-developed and well-nourished. No distress.  Cardiovascular: Normal rate.   Respiratory: Effort normal.  Musculoskeletal: Normal range of motion. She exhibits no edema and no tenderness.       Left hip: She exhibits normal range of motion and normal strength.       Left knee: She exhibits normal range of motion, no swelling and no erythema. No tenderness found.       Left ankle: Normal.       Left upper leg: Normal. She exhibits no tenderness and no swelling.       Left lower leg: Normal. She exhibits no tenderness and no swelling.  Neurological: She is alert and oriented to person, place, and time. No cranial nerve deficit. She exhibits normal muscle tone. Coordination normal.  Skin: Skin is warm and dry.  Psychiatric: She has a normal mood and affect.   EFM reactive MAU Course  Procedures  Assessment and Plan   1. Sciatica, left   2. Restless legs   3. Back pain in pregnancy, third trimester   Flexeril, percocet # 10 tabs given for pain, ambien for  sleep and restless legs F/u as scheduled  No more NSAIDS    Medication List    STOP taking these medications       naproxen sodium 220 MG tablet  Commonly known as:  ANAPROX      TAKE these medications       acetaminophen 500 MG tablet  Commonly known as:  TYLENOL  Take 1,500 mg by mouth daily as needed for pain.     cyclobenzaprine 10 MG tablet  Commonly known as:  FLEXERIL  Take 1 tablet (10 mg total) by mouth once.     oxyCODONE-acetaminophen 5-325 MG per tablet  Commonly known as:  PERCOCET/ROXICET  Take 1 tablet by mouth once.     prenatal multivitamin Tabs  Take 1 tablet by mouth daily  at 12 noon.     zolpidem 5 MG tablet  Commonly known as:  AMBIEN  Take 1 tablet (5 mg total) by mouth at bedtime as needed for sleep.            Follow-up Information   Follow up with Bing Plume, MD. (as scheduled)    Contact information:   [address], [address] [place] [address] [phone]         [facility] 12/17/2012, 11:53 AM

## 2012-12-29 ENCOUNTER — Inpatient Hospital Stay (HOSPITAL_COMMUNITY)
Admission: AD | Admit: 2012-12-29 | Discharge: 2012-12-29 | Disposition: A | Discharge status: Home / Self Care | Payer: 59 | Source: Ambulatory Visit | Attending: Obstetrics and Gynecology | Admitting: Obstetrics and Gynecology

## 2012-12-29 ENCOUNTER — Encounter (HOSPITAL_COMMUNITY): Payer: Self-pay | Admitting: *Deleted

## 2012-12-29 DIAGNOSIS — L293 Anogenital pruritus, unspecified: Secondary | ICD-10-CM | POA: Insufficient documentation

## 2012-12-29 DIAGNOSIS — O479 False labor, unspecified: Secondary | ICD-10-CM

## 2012-12-29 DIAGNOSIS — O2390 Unspecified genitourinary tract infection in pregnancy, unspecified trimester: Secondary | ICD-10-CM | POA: Insufficient documentation

## 2012-12-29 DIAGNOSIS — B373 Candidiasis of vulva and vagina: Secondary | ICD-10-CM

## 2012-12-29 DIAGNOSIS — B3731 Acute candidiasis of vulva and vagina: Secondary | ICD-10-CM

## 2012-12-29 LAB — WET PREP, GENITAL: Trich, Wet Prep: NONE SEEN

## 2012-12-29 MED ORDER — TERCONAZOLE 0.4 % VA CREA: 1.0000 | TOPICAL_CREAM | Freq: Every day | VAGINAL | Status: DC

## 2012-12-29 NOTE — MAU Note (Signed)
Pt states she has a clear discharge and has had vaginal itching for the past week-states she knows she has a yeast infection

## 2012-12-29 NOTE — MAU Provider Note (Signed)
Chief Complaint:  Vaginal Itching  First Provider Initiated Contact with Patient 12/29/12 0151     HPI: [name] is a 27 y.o. G3P2002 at [redacted]w[redacted]d who presents to maternity admissions reporting thick, white vaginal discharge and itching x 1 week. Was tested last week. Told results were normal, but Sx have become much worse. Thinks she has a yeast infection. GC/CT neg 12/01/12. Declines retesting. Also passing clear mucus. Few contractions. Denies leakage of fluid or vaginal bleeding. Good fetal movement.   Past Medical History: Past Medical History  Diagnosis Date  . Pregnancy induced hypertension   . Anxiety     Past obstetric history: OB History   Grav Para Term Preterm Abortions TAB SAB Ect Mult Living   3 2 2  0 0 0 0 0 0 2     # Outc Date GA Lbr Len/2nd Wgt Sex Del Anes PTL Lv   1 TRM 1/08 [redacted]w[redacted]d 12:00 4.111kg(9lb1oz) M SVD EPI No Yes   2 TRM 2/13 [redacted]w[redacted]d 05:40 / 00:24 1.610RU([id number]) M SVD EPI  Yes   Comments: WDL   3 CUR               Past Surgical History: Past Surgical History  Procedure Laterality Date  . No past surgeries      Family History: Family History  Problem Relation Age of Onset  . Asthma Mother   . Diabetes Maternal Grandmother   . Hypertension Maternal Grandmother   . Asthma Maternal Grandfather   . Anesthesia problems Neg Hx     Social History: History  Substance Use Topics  . Smoking status: Never Smoker   . Smokeless tobacco: Never Used  . Alcohol Use: No    Allergies:  Allergies  Allergen Reactions  . Latex Itching and Rash    Meds:  Prescriptions prior to admission  Medication Sig Dispense Refill  . cyclobenzaprine (FLEXERIL) 10 MG tablet Take 1 tablet (10 mg total) by mouth once.  30 tablet  0  . oxyCODONE-acetaminophen (PERCOCET/ROXICET) 5-325 MG per tablet Take 1 tablet by mouth once.  10 tablet  0  . zolpidem (AMBIEN) 5 MG tablet Take 1 tablet (5 mg total) by mouth at bedtime as needed for sleep.  30 tablet  0  .  acetaminophen (TYLENOL) 500 MG tablet Take 1,500 mg by mouth daily as needed for pain.      . Prenatal Vit-Fe Fumarate-FA (PRENATAL MULTIVITAMIN) TABS Take 1 tablet by mouth daily at 12 noon.        ROS: Pertinent findings in history of present illness.  Physical Exam  Blood pressure 135/83, pulse 111, temperature 98.5 F (36.9 C), temperature source Oral, resp. rate 16, height 5\' 3"  (1.6 m), weight 87.998 kg (194 lb), last menstrual period 03/29/2012. GENERAL: Well-developed, well-nourished female in no acute distress.  HEENT: normocephalic HEART: normal rate RESP: normal effort ABDOMEN: Soft, non-tender, gravid appropriate for gestational age EXTREMITIES: Nontender, no edema NEURO: alert and oriented SPECULUM EXAM: NEFG, moderate amount of thick, curd-like, odorless discharge, no blood, neg pooling, cervix clean   Dilation: 1 Effacement (%): 50 Cervical Position: Posterior Station: -3 Exam by:: V.Darya Bigler,CNM Baby very active during exam  FHT:  Baseline 145 , moderate variability, accelerations present, no decelerations Contractions: occasional, mild   Labs: Results for orders placed during the hospital encounter of 12/29/12 (from the past 24 hour(s))  WET PREP, GENITAL     Status: Abnormal   Collection Time    12/29/12  2:00 AM  Result Value Range   Yeast Wet Prep HPF POC NONE SEEN  NONE SEEN   Trich, Wet Prep NONE SEEN  NONE SEEN   Clue Cells Wet Prep HPF POC FEW (*) NONE SEEN   WBC, Wet Prep HPF POC FEW (*) NONE SEEN    Imaging:  No results found.  MAU Course:   Assessment: 1. Clinical Dx of Vulvovaginal candidiasis   2. Braxton Hicks contractions    Plan: Discharge home in stable condition. Labor precautions and fetal kick counts.  Follow-up Information   Follow up with [facility]. (as scheduled)    Contact information:   [address] [place] [address] [phone]      Follow up with [facility]  [place] [facility]ERN[facility]. (As needed if symptoms worsen)    Contact information:   [id number] [place] [address] [phone]        Medication List         acetaminophen 500 MG tablet  Commonly known as:  TYLENOL  Take 1,500 mg by mouth daily as needed for pain.     cyclobenzaprine 10 MG tablet  Commonly known as:  FLEXERIL  Take 1 tablet (10 mg total) by mouth once.     oxyCODONE-acetaminophen 5-325 MG per tablet  Commonly known as:  PERCOCET/ROXICET  Take 1 tablet by mouth once.     prenatal multivitamin Tabs  Take 1 tablet by mouth daily at 12 noon.     terconazole 0.4 % vaginal cream  Commonly known as:  TERAZOL 7  Place 1 applicator vaginally at bedtime.     zolpidem 5 MG tablet  Commonly known as:  AMBIEN  Take 1 tablet (5 mg total) by mouth at bedtime as needed for sleep.        [place], CNM 12/29/2012 2:30 AM

## 2012-12-29 NOTE — Discharge Instructions (Signed)
Candidal Vulvovaginitis Candidal vulvovaginitis is an infection of the vagina and vulva. The vulva is the skin around the opening of the vagina. This may cause itching and discomfort in and around the vagina.  HOME CARE  Only take medicine as told by your doctor.  Do not have sex (intercourse) until the infection is healed or as told by your doctor.  Practice safe sex.  Tell your sex partner about your infection.  Do not douche or use tampons.  Wear cotton underwear. Do not wear tight pants or panty hose.  Eat yogurt. This may help treat and prevent yeast infections. GET HELP RIGHT AWAY IF:   You have a fever.  Your problems get worse during treatment or do not get better in 3 days.  You have discomfort, irritation, or itching in your vagina or vulva area.  You have pain after sex.  You start to get belly (abdominal) pain. MAKE SURE YOU:  Understand these instructions.  Will watch your condition.  Will get help right away if you are not doing well or get worse. Document Released: 09/06/2008 Document Revised: 09/02/2011 Document Reviewed: 09/06/2008 [facility] Patient Information 2014 [place], [place].  Braxton Hicks Contractions Pregnancy is commonly associated with contractions of the uterus throughout the pregnancy. Towards the end of pregnancy (32 to 34 weeks), these contractions [facility](Braxton Willa RoughHicks) can develop more often and may become more forceful. This is not true labor because these contractions do not result in opening (dilatation) and thinning of the cervix. They are sometimes difficult to tell apart from true labor because these contractions can be forceful and people have different pain tolerances. You should not feel embarrassed if you go to the hospital with false labor. Sometimes, the only way to tell if you are in true labor is for your caregiver to follow the changes in the cervix. How to tell the difference between true and false labor:  False labor.  The  contractions of false labor are usually shorter, irregular and not as hard as those of true labor.  They are often felt in the front of the lower abdomen and in the groin.  They may leave with walking around or changing positions while lying down.  They get weaker and are shorter lasting as time goes on.  These contractions are usually irregular.  They do not usually become progressively stronger, regular and closer together as with true labor.  True labor.  Contractions in true labor last 30 to 70 seconds, become very regular, usually become more intense, and increase in frequency.  They do not go away with walking.  The discomfort is usually felt in the top of the uterus and spreads to the lower abdomen and low back.  True labor can be determined by your caregiver with an exam. This will show that the cervix is dilating and getting thinner. If there are no prenatal problems or other health problems associated with the pregnancy, it is completely safe to be sent home with false labor and await the onset of true labor. HOME CARE INSTRUCTIONS   Keep up with your usual exercises and instructions.  Take medications as directed.  Keep your regular prenatal appointment.  Eat and drink lightly if you think you are going into labor.  If BH contractions are making you uncomfortable:  Change your activity position from lying down or resting to walking/walking to resting.  Sit and rest in a tub of warm water.  Drink 2 to 3 glasses of water. Dehydration may cause  B-H contractions.  Do slow and deep breathing several times an hour. SEEK IMMEDIATE MEDICAL CARE IF:   Your contractions continue to become stronger, more regular, and closer together.  You have a gushing, burst or leaking of fluid from the vagina.  An oral temperature above 102 F (38.9 C) develops.  You have passage of blood-tinged mucus.  You develop vaginal bleeding.  You develop continuous belly (abdominal)  pain.  You have low back pain that you never had before.  You feel the baby's head pushing down causing pelvic pressure.  The baby is not moving as much as it used to. Document Released: 06/10/2005 Document Revised: 09/02/2011 Document Reviewed: 12/02/2008 [facility] Patient Information 2014 [place] PlaceExitCare, [place].  Fetal Movement Counts Patient Name: __________________________________________________ Patient Due Date: ____________________ Performing a fetal movement count is highly recommended in high-risk pregnancies, but it is good for every pregnant woman to do. Your caregiver may ask you to start counting fetal movements at 28 weeks of the pregnancy. Fetal movements often increase:  After eating a full meal.  After physical activity.  After eating or drinking something sweet or cold.  At rest. Pay attention to when you feel the baby is most active. This will help you notice a pattern of your baby's sleep and wake cycles and what factors contribute to an increase in fetal movement. It is important to perform a fetal movement count at the same time each day when your baby is normally most active.  HOW TO COUNT FETAL MOVEMENTS 1. Find a quiet and comfortable area to sit or lie down on your left side. Lying on your left side provides the best blood and oxygen circulation to your baby. 2. Write down the day and time on a sheet of paper or in a journal. 3. Start counting kicks, flutters, swishes, rolls, or jabs in a 2 hour period. You should feel at least 10 movements within 2 hours. 4. If you do not feel 10 movements in 2 hours, wait 2 3 hours and count again. Look for a change in the pattern or not enough counts in 2 hours. SEEK MEDICAL CARE IF:  You feel less than 10 counts in 2 hours, tried twice.  There is no movement in over an hour.  The pattern is changing or taking longer each day to reach 10 counts in 2 hours.  You feel the baby is not moving as he or she usually does. Date:  ____________ Movements: ____________ Start time: ____________ Sabrina MartinFinish time: ____________  Date: ____________ Movements: ____________ Start time: ____________ Sabrina MartinFinish time: ____________ Date: ____________ Movements: ____________ Start time: ____________ Sabrina MartinFinish time: ____________ Date: ____________ Movements: ____________ Start time: ____________ Sabrina MartinFinish time: ____________ Date: ____________ Movements: ____________ Start time: ____________ Sabrina MartinFinish time: ____________ Date: ____________ Movements: ____________ Start time: ____________ Sabrina MartinFinish time: ____________ Date: ____________ Movements: ____________ Start time: ____________ Sabrina MartinFinish time: ____________ Date: ____________ Movements: ____________ Start time: ____________ Sabrina MartinFinish time: ____________  Date: ____________ Movements: ____________ Start time: ____________ Sabrina MartinFinish time: ____________ Date: ____________ Movements: ____________ Start time: ____________ Sabrina MartinFinish time: ____________ Date: ____________ Movements: ____________ Start time: ____________ Sabrina MartinFinish time: ____________ Date: ____________ Movements: ____________ Start time: ____________ Sabrina MartinFinish time: ____________ Date: ____________ Movements: ____________ Start time: ____________ Sabrina MartinFinish time: ____________ Date: ____________ Movements: ____________ Start time: ____________ Sabrina MartinFinish time: ____________ Date: ____________ Movements: ____________ Start time: ____________ Sabrina MartinFinish time: ____________  Date: ____________ Movements: ____________ Start time: ____________ Sabrina MartinFinish time: ____________ Date: ____________ Movements: ____________ Start time: ____________ Sabrina MartinFinish time: ____________ Date: ____________ Movements: ____________ Start time: ____________ Sabrina MartinFinish  time: ____________ Date: ____________ Movements: ____________ Start time: ____________ Sabrina MartinFinish time: ____________ Date: ____________ Movements: ____________ Start time: ____________ Sabrina MartinFinish time: ____________ Date: ____________ Movements: ____________ Start  time: ____________ Sabrina MartinFinish time: ____________ Date: ____________ Movements: ____________ Start time: ____________ Sabrina MartinFinish time: ____________  Date: ____________ Movements: ____________ Start time: ____________ Sabrina MartinFinish time: ____________ Date: ____________ Movements: ____________ Start time: ____________ Sabrina MartinFinish time: ____________ Date: ____________ Movements: ____________ Start time: ____________ Sabrina MartinFinish time: ____________ Date: ____________ Movements: ____________ Start time: ____________ Sabrina MartinFinish time: ____________ Date: ____________ Movements: ____________ Start time: ____________ Sabrina MartinFinish time: ____________ Date: ____________ Movements: ____________ Start time: ____________ Sabrina MartinFinish time: ____________ Date: ____________ Movements: ____________ Start time: ____________ Sabrina MartinFinish time: ____________  Date: ____________ Movements: ____________ Start time: ____________ Sabrina MartinFinish time: ____________ Date: ____________ Movements: ____________ Start time: ____________ Sabrina MartinFinish time: ____________ Date: ____________ Movements: ____________ Start time: ____________ Sabrina MartinFinish time: ____________ Date: ____________ Movements: ____________ Start time: ____________ Sabrina MartinFinish time: ____________ Date: ____________ Movements: ____________ Start time: ____________ Sabrina MartinFinish time: ____________ Date: ____________ Movements: ____________ Start time: ____________ Sabrina MartinFinish time: ____________ Date: ____________ Movements: ____________ Start time: ____________ Sabrina MartinFinish time: ____________  Date: ____________ Movements: ____________ Start time: ____________ Sabrina MartinFinish time: ____________ Date: ____________ Movements: ____________ Start time: ____________ Sabrina MartinFinish time: ____________ Date: ____________ Movements: ____________ Start time: ____________ Sabrina MartinFinish time: ____________ Date: ____________ Movements: ____________ Start time: ____________ Sabrina MartinFinish time: ____________ Date: ____________ Movements: ____________ Start time: ____________ Sabrina MartinFinish time:  ____________ Date: ____________ Movements: ____________ Start time: ____________ Sabrina MartinFinish time: ____________ Date: ____________ Movements: ____________ Start time: ____________ Sabrina MartinFinish time: ____________  Date: ____________ Movements: ____________ Start time: ____________ Sabrina MartinFinish time: ____________ Date: ____________ Movements: ____________ Start time: ____________ Sabrina MartinFinish time: ____________ Date: ____________ Movements: ____________ Start time: ____________ Sabrina MartinFinish time: ____________ Date: ____________ Movements: ____________ Start time: ____________ Sabrina MartinFinish time: ____________ Date: ____________ Movements: ____________ Start time: ____________ Sabrina MartinFinish time: ____________ Date: ____________ Movements: ____________ Start time: ____________ Sabrina MartinFinish time: ____________ Date: ____________ Movements: ____________ Start time: ____________ Sabrina MartinFinish time: ____________  Date: ____________ Movements: ____________ Start time: ____________ Sabrina MartinFinish time: ____________ Date: ____________ Movements: ____________ Start time: ____________ Sabrina MartinFinish time: ____________ Date: ____________ Movements: ____________ Start time: ____________ Sabrina MartinFinish time: ____________ Date: ____________ Movements: ____________ Start time: ____________ Sabrina MartinFinish time: ____________ Date: ____________ Movements: ____________ Start time: ____________ Sabrina MartinFinish time: ____________ Date: ____________ Movements: ____________ Start time: ____________ Sabrina MartinFinish time: ____________ Document Released: 07/10/2006 Document Revised: 05/27/2012 Document Reviewed: 04/06/2012 Exit[employer] Patient Information 2014 [place]Care, [place].

## 2013-01-03 ENCOUNTER — Encounter (HOSPITAL_COMMUNITY): Payer: Self-pay | Admitting: *Deleted

## 2013-01-03 ENCOUNTER — Inpatient Hospital Stay (HOSPITAL_COMMUNITY)
Admission: AD | Admit: 2013-01-03 | Discharge: 2013-01-03 | Disposition: A | Discharge status: Home / Self Care | Payer: 59 | Source: Ambulatory Visit | Attending: Obstetrics and Gynecology | Admitting: Obstetrics and Gynecology

## 2013-01-03 DIAGNOSIS — M545 Low back pain, unspecified: Secondary | ICD-10-CM | POA: Insufficient documentation

## 2013-01-03 DIAGNOSIS — O479 False labor, unspecified: Secondary | ICD-10-CM | POA: Insufficient documentation

## 2013-01-03 LAB — URINALYSIS, ROUTINE W REFLEX MICROSCOPIC
Glucose, UA: NEGATIVE mg/dL
Hgb urine dipstick: NEGATIVE
Protein, ur: NEGATIVE mg/dL
pH: 7.5 (ref 5.0–8.0)

## 2013-01-03 LAB — URINE MICROSCOPIC-ADD ON

## 2013-01-03 NOTE — MAU Note (Signed)
Pt c/o a constant lower back pain that started yesterday.Pt does not think she is having ctx and reports good fetal movement.

## 2013-01-03 NOTE — Progress Notes (Signed)
Dr Jackelyn Knife notified of pt's arrival and complaints, VE, FHR pattern, contraction pattern orders received to discharge home

## 2013-01-07 ENCOUNTER — Encounter (HOSPITAL_COMMUNITY): Payer: Self-pay | Admitting: Anesthesiology

## 2013-01-07 ENCOUNTER — Encounter (HOSPITAL_COMMUNITY): Payer: Self-pay | Admitting: *Deleted

## 2013-01-07 ENCOUNTER — Inpatient Hospital Stay (HOSPITAL_COMMUNITY): Payer: 59 | Admitting: Anesthesiology

## 2013-01-07 ENCOUNTER — Inpatient Hospital Stay (HOSPITAL_COMMUNITY)
Admission: AD | Admit: 2013-01-07 | Discharge: 2013-01-09 | DRG: 775 | Disposition: A | Discharge status: Home / Self Care | Payer: 59 | Source: Ambulatory Visit | Attending: Obstetrics and Gynecology | Admitting: Obstetrics and Gynecology

## 2013-01-07 DIAGNOSIS — Z3490 Encounter for supervision of normal pregnancy, unspecified, unspecified trimester: Secondary | ICD-10-CM

## 2013-01-07 DIAGNOSIS — O0930 Supervision of pregnancy with insufficient antenatal care, unspecified trimester: Secondary | ICD-10-CM

## 2013-01-07 LAB — CBC
HCT: 33.4 % — ABNORMAL LOW (ref 36.0–46.0)
MCHC: 34.4 g/dL (ref 30.0–36.0)
RDW: 15.7 % — ABNORMAL HIGH (ref 11.5–15.5)

## 2013-01-07 LAB — RPR: RPR Ser Ql: NONREACTIVE

## 2013-01-07 MED ORDER — EPHEDRINE 5 MG/ML INJ
10.0000 mg | INTRAVENOUS | Status: DC | PRN
  Filled 2013-01-07: qty 2
  Filled 2013-01-07: qty 4

## 2013-01-07 MED ORDER — PHENYLEPHRINE 40 MCG/ML (10ML) SYRINGE FOR IV PUSH (FOR BLOOD PRESSURE SUPPORT)
80.0000 ug | PREFILLED_SYRINGE | INTRAVENOUS | Status: DC | PRN
  Filled 2013-01-07: qty 2

## 2013-01-07 MED ORDER — LACTATED RINGERS IV SOLN
500.0000 mL | Freq: Once | INTRAVENOUS | Status: AC
  Administered 2013-01-07: 500 mL via INTRAVENOUS

## 2013-01-07 MED ORDER — OXYTOCIN BOLUS FROM INFUSION
500.0000 mL | INTRAVENOUS | Status: DC
  Administered 2013-01-08: 500 mL via INTRAVENOUS

## 2013-01-07 MED ORDER — SODIUM BICARBONATE 8.4 % IV SOLN
INTRAVENOUS | Status: DC | PRN
  Administered 2013-01-07: 5 mL via EPIDURAL

## 2013-01-07 MED ORDER — CITRIC ACID-SODIUM CITRATE 334-500 MG/5ML PO SOLN: 30.0000 mL | ORAL | Status: DC | PRN

## 2013-01-07 MED ORDER — PHENYLEPHRINE 40 MCG/ML (10ML) SYRINGE FOR IV PUSH (FOR BLOOD PRESSURE SUPPORT)
80.0000 ug | PREFILLED_SYRINGE | INTRAVENOUS | Status: DC | PRN
Start: 2013-01-07 — End: 2013-01-08
  Filled 2013-01-07: qty 5
  Filled 2013-01-07: qty 2

## 2013-01-07 MED ORDER — TERBUTALINE SULFATE 1 MG/ML IJ SOLN: 0.2500 mg | Freq: Once | INTRAMUSCULAR | Status: AC | PRN

## 2013-01-07 MED ORDER — LACTATED RINGERS IV SOLN
INTRAVENOUS | Status: DC
  Administered 2013-01-07: 125 mL/h via INTRAVENOUS
  Administered 2013-01-07: 19:00:00 via INTRAVENOUS
  Administered 2013-01-07: 125 mL/h via INTRAVENOUS

## 2013-01-07 MED ORDER — OXYCODONE-ACETAMINOPHEN 5-325 MG PO TABS: 1.0000 | ORAL_TABLET | ORAL | Status: DC | PRN

## 2013-01-07 MED ORDER — ACETAMINOPHEN 325 MG PO TABS
650.0000 mg | ORAL_TABLET | ORAL | Status: DC | PRN
  Administered 2013-01-07 (×2): 650 mg via ORAL
  Filled 2013-01-07 (×2): qty 2

## 2013-01-07 MED ORDER — LIDOCAINE HCL (PF) 1 % IJ SOLN
30.0000 mL | INTRAMUSCULAR | Status: DC | PRN
  Filled 2013-01-07 (×2): qty 30

## 2013-01-07 MED ORDER — OXYTOCIN 40 UNITS IN LACTATED RINGERS INFUSION - SIMPLE MED
62.5000 mL/h | INTRAVENOUS | Status: DC
  Administered 2013-01-08: 62.5 mL/h via INTRAVENOUS

## 2013-01-07 MED ORDER — LACTATED RINGERS IV SOLN: 500.0000 mL | INTRAVENOUS | Status: DC | PRN

## 2013-01-07 MED ORDER — OXYTOCIN 40 UNITS IN LACTATED RINGERS INFUSION - SIMPLE MED
1.0000 m[IU]/min | INTRAVENOUS | Status: DC
  Administered 2013-01-07: 2 m[IU]/min via INTRAVENOUS
  Filled 2013-01-07: qty 1000

## 2013-01-07 MED ORDER — DIPHENHYDRAMINE HCL 50 MG/ML IJ SOLN: 12.5000 mg | INTRAMUSCULAR | Status: DC | PRN

## 2013-01-07 MED ORDER — FENTANYL 2.5 MCG/ML BUPIVACAINE 1/10 % EPIDURAL INFUSION (WH - ANES)
14.0000 mL/h | INTRAMUSCULAR | Status: DC | PRN
  Administered 2013-01-07: 14 mL/h via EPIDURAL
  Filled 2013-01-07: qty 125

## 2013-01-07 MED ORDER — IBUPROFEN 600 MG PO TABS
600.0000 mg | ORAL_TABLET | Freq: Four times a day (QID) | ORAL | Status: DC | PRN
  Administered 2013-01-08: 600 mg via ORAL
  Filled 2013-01-07: qty 1

## 2013-01-07 MED ORDER — ONDANSETRON HCL 4 MG/2ML IJ SOLN: 4.0000 mg | Freq: Four times a day (QID) | INTRAMUSCULAR | Status: DC | PRN

## 2013-01-07 MED ORDER — EPHEDRINE 5 MG/ML INJ
10.0000 mg | INTRAVENOUS | Status: DC | PRN
  Filled 2013-01-07: qty 2

## 2013-01-07 NOTE — H&P (Signed)
[name] is a 27 y.o. female, G3 P2002, EGA 40+ weeks with EDV 7-13 presenting for elective induction.  Late prenatal care, didn't start until 27 weeks, uncomplicated, see prenatal records for complete history.   Maternal Medical History:  Fetal activity: Perceived fetal activity is normal.      OB History   Grav Para Term Preterm Abortions TAB SAB Ect Mult Living   3 2 2  0 0 0 0 0 0 2    SVD at term x 2  Past Medical History  Diagnosis Date  . Pregnancy induced hypertension   . Anxiety    Past Surgical History  Procedure Laterality Date  . No past surgeries     Family History: family history includes Asthma in her maternal grandfather and mother; Diabetes in her maternal grandmother; and Hypertension in her maternal grandmother.  There is no history of Anesthesia problems. Social History:  reports that she has never smoked. She has never used smokeless tobacco. She reports that she does not drink alcohol or use illicit drugs.   Prenatal Transfer Tool  Maternal Diabetes: No Genetic Screening: Declined Maternal Ultrasounds/Referrals: Normal Fetal Ultrasounds or other Referrals:  None Maternal Substance Abuse:  No Significant Maternal Medications:  None Significant Maternal Lab Results:  Lab values include: Group B Strep negative Other Comments:  None  Review of Systems  Respiratory: Negative.   Cardiovascular: Negative.     Dilation: 1.5 Effacement (%): 50 Station: -2 Exam by:: [name] Blood pressure 122/75, pulse 96, temperature 98.4 F (36.9 C), temperature source Oral, resp. rate 18, height 5\' 3"  (1.6 m), weight 88.451 kg (195 lb), last menstrual period 03/29/2012. Maternal Exam:  Uterine Assessment: Contraction strength is mild.  Contraction frequency is irregular.   Abdomen: Patient reports no abdominal tenderness. Estimated fetal weight is 7 1/2 lbs.   Fetal presentation: vertex  Introitus: Normal vulva. Normal vagina.  Amniotic fluid character: not  assessed.  Pelvis: adequate for delivery.   Cervix: Cervix evaluated by digital exam.     Fetal Exam Fetal Monitor Review: Mode: ultrasound.   Baseline rate: 130.  Variability: moderate (6-25 bpm).   Pattern: accelerations present and no decelerations.    Fetal State Assessment: Category I - tracings are normal.     Physical Exam  Constitutional: She appears well-developed and well-nourished.  Cardiovascular: Normal rate, regular rhythm and normal heart sounds.   No murmur heard. Respiratory: Effort normal and breath sounds normal. No respiratory distress. She has no wheezes.  GI: Soft.  Gravid     Prenatal labs: ABO, Rh:  AB pos Antibody:  neg Rubella:  Imm RPR:   NR HBsAg:   Neg HIV: Non-reactive (04/17 0000)  GBS: Negative (06/10 0000)  GCT:  99  Assessment/Plan: IUP at 40+ weeks for elective induction.  On pitocin, unable to AROM yet, will monitor progress.     [name] D 01/07/2013, 1:32 PM

## 2013-01-07 NOTE — Anesthesia Preprocedure Evaluation (Signed)
Anesthesia Evaluation  Patient identified by MRN, date of birth, ID band Patient awake    Reviewed: Allergy & Precautions, H&P , Patient's Chart, lab work & pertinent test results  Airway Mallampati: II  TM Distance: >3 FB Neck ROM: full    Dental  (+) Teeth Intact   Pulmonary    breath sounds clear to auscultation       Cardiovascular hypertension,  Rhythm:regular Rate:Normal     Neuro/Psych    GI/Hepatic   Endo/Other  Morbid obesity  Renal/GU      Musculoskeletal   Abdominal   Peds  Hematology   Anesthesia Other Findings       Reproductive/Obstetrics (+) Pregnancy                             Anesthesia Physical Anesthesia Plan  ASA: III  Anesthesia Plan: Epidural   Post-op Pain Management:    Induction:   Airway Management Planned:   Additional Equipment:   Intra-op Plan:   Post-operative Plan:   Informed Consent: I have reviewed the patients History and Physical, chart, labs and discussed the procedure including the risks, benefits and alternatives for the proposed anesthesia with the patient or authorized representative who has indicated his/her understanding and acceptance.   Dental Advisory Given  Plan Discussed with:   Anesthesia Plan Comments: (Labs checked- platelets confirmed with RN in room. Fetal heart tracing, per RN, reported to be stable enough for sitting procedure. Discussed epidural, and patient consents to the procedure:  included risk of possible headache,backache, failed block, allergic reaction, and nerve injury. This patient was asked if she had any questions or concerns before the procedure started.)        Anesthesia Quick Evaluation  

## 2013-01-07 NOTE — Progress Notes (Signed)
Feeling ctx Afeb, VSS FHT- Cat I, ctx q 3 min on 18 mu/min pitocin VE- 2-3/70/-2, vtx, attemted AROM-no fluid Continue pitocin and monitor progress

## 2013-01-07 NOTE — Anesthesia Procedure Notes (Signed)

## 2013-01-08 ENCOUNTER — Encounter (HOSPITAL_COMMUNITY): Payer: Self-pay | Admitting: Family Medicine

## 2013-01-08 MED ORDER — WITCH HAZEL-GLYCERIN EX PADS: 1.0000 "application " | MEDICATED_PAD | CUTANEOUS | Status: DC | PRN

## 2013-01-08 MED ORDER — IBUPROFEN 600 MG PO TABS
600.0000 mg | ORAL_TABLET | Freq: Four times a day (QID) | ORAL | Status: DC
  Administered 2013-01-08 – 2013-01-09 (×5): 600 mg via ORAL
  Filled 2013-01-08 (×5): qty 1

## 2013-01-08 MED ORDER — METHYLERGONOVINE MALEATE 0.2 MG PO TABS: 0.2000 mg | ORAL_TABLET | ORAL | Status: DC | PRN

## 2013-01-08 MED ORDER — LANOLIN HYDROUS EX OINT: TOPICAL_OINTMENT | CUTANEOUS | Status: DC | PRN

## 2013-01-08 MED ORDER — DIBUCAINE 1 % RE OINT: 1.0000 "application " | TOPICAL_OINTMENT | RECTAL | Status: DC | PRN

## 2013-01-08 MED ORDER — SENNOSIDES-DOCUSATE SODIUM 8.6-50 MG PO TABS
2.0000 | ORAL_TABLET | Freq: Every day | ORAL | Status: DC
  Administered 2013-01-08: 2 via ORAL

## 2013-01-08 MED ORDER — GUAIFENESIN 100 MG/5ML PO SOLN
15.0000 mL | Freq: Four times a day (QID) | ORAL | Status: DC | PRN
  Administered 2013-01-08 (×2): 300 mg via ORAL
  Filled 2013-01-08 (×2): qty 15

## 2013-01-08 MED ORDER — BENZOCAINE-MENTHOL 20-0.5 % EX AERO
1.0000 "application " | INHALATION_SPRAY | CUTANEOUS | Status: DC | PRN
  Administered 2013-01-08: 1 via TOPICAL
  Filled 2013-01-08: qty 56

## 2013-01-08 MED ORDER — PRENATAL MULTIVITAMIN CH
1.0000 | ORAL_TABLET | Freq: Every day | ORAL | Status: DC
  Administered 2013-01-08 – 2013-01-09 (×2): 1 via ORAL
  Filled 2013-01-08: qty 1

## 2013-01-08 MED ORDER — SIMETHICONE 80 MG PO CHEW: 80.0000 mg | CHEWABLE_TABLET | ORAL | Status: DC | PRN

## 2013-01-08 MED ORDER — TETANUS-DIPHTH-ACELL PERTUSSIS 5-2.5-18.5 LF-MCG/0.5 IM SUSP: 0.5000 mL | Freq: Once | INTRAMUSCULAR | Status: DC

## 2013-01-08 MED ORDER — OXYCODONE-ACETAMINOPHEN 5-325 MG PO TABS
1.0000 | ORAL_TABLET | ORAL | Status: DC | PRN
  Administered 2013-01-08: 1 via ORAL
  Administered 2013-01-08: 2 via ORAL
  Administered 2013-01-08: 1 via ORAL
  Filled 2013-01-08: qty 1
  Filled 2013-01-08: qty 2
  Filled 2013-01-08: qty 1

## 2013-01-08 MED ORDER — ONDANSETRON HCL 4 MG PO TABS: 4.0000 mg | ORAL_TABLET | ORAL | Status: DC | PRN

## 2013-01-08 MED ORDER — ZOLPIDEM TARTRATE 5 MG PO TABS: 5.0000 mg | ORAL_TABLET | Freq: Every evening | ORAL | Status: DC | PRN

## 2013-01-08 MED ORDER — MEASLES, MUMPS & RUBELLA VAC ~~LOC~~ INJ
0.5000 mL | INJECTION | Freq: Once | SUBCUTANEOUS | Status: DC
  Filled 2013-01-08: qty 0.5

## 2013-01-08 MED ORDER — ONDANSETRON HCL 4 MG/2ML IJ SOLN: 4.0000 mg | INTRAMUSCULAR | Status: DC | PRN

## 2013-01-08 MED ORDER — METHYLERGONOVINE MALEATE 0.2 MG/ML IJ SOLN: 0.2000 mg | INTRAMUSCULAR | Status: DC | PRN

## 2013-01-08 MED ORDER — MAGNESIUM HYDROXIDE 400 MG/5ML PO SUSP: 30.0000 mL | ORAL | Status: DC | PRN

## 2013-01-08 MED ORDER — DIPHENHYDRAMINE HCL 25 MG PO CAPS: 25.0000 mg | ORAL_CAPSULE | Freq: Four times a day (QID) | ORAL | Status: DC | PRN

## 2013-01-08 NOTE — Lactation Note (Addendum)
This note was copied from the chart of Sabrina Carly Sabo. Lactation Consultation Note: Mom's choice to breast feed her infant noted in chart on admission on 01/07/13 at 1108  Patient Name: [name] Today's Date: 01/08/2013 Reason for consult: Initial assessment   Maternal Data Formula Feeding for Exclusion: No Infant to breast within first hour of birth: Yes Has patient been taught Hand Expression?: Yes Does the patient have breastfeeding experience prior to this delivery?: Yes  Feeding   LATCH Score/Interventions                      Lactation Tools Discussed/Used     Consult Status Consult Status: PRN  Initial visit with experienced BF mom who reports that baby is nursing well. Baby skin to skin with mom asleep on her chest. BF brochure given with resources for support after DC. No questions at present. To call for assist prn  Pamelia Hoit 01/08/2013, 1:03 PM

## 2013-01-08 NOTE — Progress Notes (Signed)
PPD #0 No problems Afeb, VSS Fundus firm, NT at U-1 Continue routine postpartum care 

## 2013-01-08 NOTE — Progress Notes (Signed)
Office reported patient received Tdap there on 10/08/12. Not able to update historical immunization record to reflect this information.

## 2013-01-08 NOTE — Anesthesia Postprocedure Evaluation (Signed)
  Anesthesia Post-op Note  Patient: [name]  Procedure(s) Performed: * No procedures listed *  Patient Location: PACU and Mother/Baby  Anesthesia Type:Epidural  Level of Consciousness: awake, alert  and oriented  Airway and Oxygen Therapy: Patient Spontanous Breathing  Post-op Pain: none  Post-op Assessment: Post-op Vital signs reviewed, Patient's Cardiovascular Status Stable, No headache, No backache, No residual numbness and No residual motor weakness  Post-op Vital Signs: Reviewed and stable  Complications: No apparent anesthesia complications

## 2013-01-08 NOTE — Progress Notes (Signed)
Comfortable with epidural Afeb, VSS FHT- Cat I, ctx q 2-3 min VE- 4/80/-1, vtx, no BOW palpable, no fluid leaking Continue pitocin, anticipate SVD

## 2013-01-09 MED ORDER — OXYCODONE-ACETAMINOPHEN 5-325 MG PO TABS: 1.0000 | ORAL_TABLET | ORAL | Status: DC | PRN

## 2013-01-09 MED ORDER — IBUPROFEN 600 MG PO TABS: 600.0000 mg | ORAL_TABLET | Freq: Four times a day (QID) | ORAL | Status: DC

## 2013-01-09 NOTE — Progress Notes (Signed)
Post Partum Day 1  Subjective: no complaints, up ad lib and tolerating PO Requests early d/c Objective: Blood pressure 126/76, pulse 86, temperature 97.5 F (36.4 C), temperature source Oral, resp. rate 18, height 5\' 3"  (1.6 m), weight 88.451 kg (195 lb), last menstrual period 03/29/2012, SpO2 100.00%, unknown if currently breastfeeding.  Physical Exam:  General: alert and cooperative Lochia: appropriate Uterine Fundus: firm   Recent Labs  01/07/13 1110  HGB 11.5*  HCT 33.4*    Assessment/Plan: Discharge home if baby able to go Percocet and motrin   LOS: 2 days   Aariel Ems W 01/09/2013, 8:57 AM

## 2013-01-09 NOTE — Clinical Social Work Note (Signed)
CSW spoke with MOB regarding hx of anxiety.  MOB reports no significant symptoms at this time, however expressed having some anxiety symptoms during pregnancy.  MOB stated she has EAP through her employment which gives her 5 free counseling sessions so that she can learn coping skills for when symptoms arise.  MOB does not feel medication is needed at this time.  No barriers to discharge.  Please reconsult CSW if further needs arise.  [phone]

## 2013-01-09 NOTE — Discharge Summary (Signed)
Obstetric Discharge Summary Reason for Admission: induction of labor Prenatal Procedures: none Intrapartum Procedures: spontaneous vaginal delivery Postpartum Procedures: none Complications-Operative and Postpartum: none Hemoglobin  Date Value Range Status  01/07/2013 11.5* 12.0 - 15.0 g/dL Final     HCT  Date Value Range Status  01/07/2013 33.4* 36.0 - 46.0 % Final    Physical Exam:  General: alert and cooperative Lochia: appropriate Uterine Fundus: firm  Discharge Diagnoses: Term Pregnancy-delivered  Discharge Information: Date: 01/09/2013 Activity: pelvic rest Diet: routine Medications: Ibuprofen and Percocet Condition: improved Instructions: refer to practice specific booklet Discharge to: home Follow-up Information   Follow up with MEISINGER,TODD D, MD. Schedule an appointment as soon as possible for a visit in 6 weeks. (postpartum exam)    Contact information:   [address], [address] [place] [address] [phone]       Newborn Data: Live born female  Birth Weight: 7 lb 9 oz (3430 g) APGAR: 6, 9  Home with mother.  [name] W 01/09/2013, 9:00 AM

## 2013-02-25 ENCOUNTER — Emergency Department (INDEPENDENT_AMBULATORY_CARE_PROVIDER_SITE_OTHER)
Admission: EM | Admit: 2013-02-25 | Discharge: 2013-02-25 | Disposition: A | Discharge status: Home / Self Care | Payer: 59 | Source: Home / Self Care | Attending: Emergency Medicine | Admitting: Emergency Medicine

## 2013-02-25 ENCOUNTER — Encounter (HOSPITAL_COMMUNITY): Payer: Self-pay | Admitting: Emergency Medicine

## 2013-02-25 DIAGNOSIS — K047 Periapical abscess without sinus: Secondary | ICD-10-CM

## 2013-02-25 DIAGNOSIS — K044 Acute apical periodontitis of pulpal origin: Secondary | ICD-10-CM

## 2013-02-25 MED ORDER — OXYCODONE-ACETAMINOPHEN 5-325 MG PO TABS: 1.0000 | ORAL_TABLET | ORAL | Status: DC | PRN

## 2013-02-25 MED ORDER — AMOXICILLIN 500 MG PO CAPS: 500.0000 mg | ORAL_CAPSULE | Freq: Three times a day (TID) | ORAL | Status: DC

## 2013-02-25 NOTE — ED Provider Notes (Signed)
CSN: [id number]     Arrival date & time 02/25/13  1817 History   First MD Initiated Contact with Patient 02/25/13 1828     Chief Complaint  Patient presents with  . Dental Pain   (Consider location/radiation/quality/duration/timing/severity/associated sxs/prior Treatment) HPI 27 yo F with history of recurrent dental issues. Has an appt on Saturday for broken tooth extraction x3. She states they could not get her in before then, and they advised she come to the Urgent Care for antibiotics and pain medication. She has used Tylenol and ibuprofen with little relief. Last time she took antibiotics for her teeth was 6 months ago. She has some bleeding of gums and irritation of gum and inside jaw. She denies fevers, some headaches.  Past Medical History  Diagnosis Date  . Pregnancy induced hypertension   . Anxiety    Past Surgical History  Procedure Laterality Date  . No past surgeries     Family History  Problem Relation Age of Onset  . Asthma Mother   . Diabetes Maternal Grandmother   . Hypertension Maternal Grandmother   . Asthma Maternal Grandfather   . Anesthesia problems Neg Hx    History  Substance Use Topics  . Smoking status: Never Smoker   . Smokeless tobacco: Never Used  . Alcohol Use: No   OB History   Grav Para Term Preterm Abortions TAB SAB Ect Mult Living   3 3 3  0 0 0 0 0 0 3     Review of Systems  Constitutional: Negative for fever and chills.  HENT: Positive for mouth sores and dental problem. Negative for congestion, sore throat, drooling and trouble swallowing.   Eyes: Negative for visual disturbance.  Respiratory: Negative for cough and shortness of breath.   Cardiovascular: Negative for chest pain and leg swelling.  Gastrointestinal: Negative for abdominal pain.  Genitourinary: Negative for dysuria.  Musculoskeletal: Negative for myalgias and arthralgias.  Skin: Negative for rash.  Neurological: Negative for headaches.    Allergies  Latex  Home  Medications   Current Outpatient Rx  Name  Route  Sig  Dispense  Refill  . ibuprofen (ADVIL,MOTRIN) 600 MG tablet   Oral   Take 1 tablet (600 mg total) by mouth every 6 (six) hours.   30 tablet   0   . amoxicillin (AMOXIL) 500 MG capsule   Oral   Take 1 capsule (500 mg total) by mouth 3 (three) times daily.   30 capsule   0   . oxyCODONE-acetaminophen (PERCOCET/ROXICET) 5-325 MG per tablet   Oral   Take 1-2 tablets by mouth every 4 (four) hours as needed.   20 tablet   0   . Prenatal Vit-Fe Fumarate-FA (PRENATAL MULTIVITAMIN) TABS   Oral   Take 1 tablet by mouth daily at 12 noon.          BP 146/92  Pulse 78  Temp(Src) 99 F (37.2 C) (Oral)  Resp 16  SpO2 100%  Breastfeeding? Yes Physical Exam  Constitutional: She is oriented to person, place, and time. She appears well-developed and well-nourished. No distress.  HENT:  Head: Normocephalic and atraumatic.  Mouth/Throat: Abnormal dentition (2 broken teeth at gumline on right upper, and one partially broken on left upper).  Eyes: Pupils are equal, round, and reactive to light.  Neck: Normal range of motion.  Cardiovascular: Normal rate, regular rhythm and normal heart sounds.   Pulmonary/Chest: Effort normal and breath sounds normal. She has no wheezes.  Abdominal: Soft. There  is no tenderness.  Lymphadenopathy:    She has no cervical adenopathy.  Neurological: She is alert and oriented to person, place, and time. No cranial nerve deficit.  Skin: Skin is warm and dry. No rash noted.   ED Course  Procedures (including critical care time) Labs Review Labs Reviewed - No data to display Imaging Review No results found.  MDM   1. Dental infection    27 yo F with broken teeth pending dentist evaluation in 2 days - Amoxicillin po TID x 10 days - Percocet prn for pain - F/u with dentist as scheduled for extraction - F/u with PCP as needed - Patient agrees with plan    Hilarie Fredrickson, MD 02/25/13 [phone]

## 2013-02-25 NOTE — ED Provider Notes (Signed)
Medical screening examination/treatment/procedure(s) were performed by a resident physician and as supervising physician I was immediately available for consultation/collaboration.  Leslee Home, M.D.  Reuben Likes, MD 02/25/13 [phone]

## 2013-02-25 NOTE — ED Notes (Signed)
Pt c/o dental pain onset Sunday Sxs include: facial swelling and pain Has appt w/Dentist on Saturday but needing antibiotics before Alert w/no signs of acute distress.

## 2013-05-16 DIAGNOSIS — K029 Dental caries, unspecified: Secondary | ICD-10-CM | POA: Insufficient documentation

## 2013-05-16 DIAGNOSIS — Z79899 Other long term (current) drug therapy: Secondary | ICD-10-CM | POA: Insufficient documentation

## 2013-05-16 DIAGNOSIS — F411 Generalized anxiety disorder: Secondary | ICD-10-CM | POA: Insufficient documentation

## 2013-05-16 DIAGNOSIS — Z91040 Latex allergy status: Secondary | ICD-10-CM | POA: Insufficient documentation

## 2013-05-17 ENCOUNTER — Encounter (HOSPITAL_COMMUNITY): Payer: Self-pay | Admitting: Emergency Medicine

## 2013-05-17 ENCOUNTER — Emergency Department (HOSPITAL_COMMUNITY)
Admission: EM | Admit: 2013-05-17 | Discharge: 2013-05-17 | Disposition: A | Discharge status: Home / Self Care | Payer: 59 | Attending: Emergency Medicine | Admitting: Emergency Medicine

## 2013-05-17 DIAGNOSIS — K029 Dental caries, unspecified: Secondary | ICD-10-CM

## 2013-05-17 MED ORDER — TRAMADOL HCL 50 MG PO TABS
50.0000 mg | ORAL_TABLET | Freq: Once | ORAL | Status: AC
  Administered 2013-05-17: 50 mg via ORAL
  Filled 2013-05-17: qty 1

## 2013-05-17 MED ORDER — PENICILLIN V POTASSIUM 500 MG PO TABS: 500.0000 mg | ORAL_TABLET | Freq: Four times a day (QID) | ORAL | Status: DC

## 2013-05-17 MED ORDER — PENICILLIN V POTASSIUM 250 MG PO TABS
500.0000 mg | ORAL_TABLET | Freq: Once | ORAL | Status: AC
  Administered 2013-05-17: 500 mg via ORAL
  Filled 2013-05-17: qty 2

## 2013-05-17 MED ORDER — TRAMADOL HCL 50 MG PO TABS: 50.0000 mg | ORAL_TABLET | Freq: Four times a day (QID) | ORAL | Status: DC | PRN

## 2013-05-17 NOTE — ED Provider Notes (Signed)
Medical screening examination/treatment/procedure(s) were performed by non-physician practitioner and as supervising physician I was immediately available for consultation/collaboration.    Ileene Allie M Katisha Shimizu, MD 05/17/13 0332 

## 2013-05-17 NOTE — ED Provider Notes (Signed)
CSN: [id number]     Arrival date & time 05/16/13  2347 History   First MD Initiated Contact with Patient 05/17/13 0039     Chief Complaint  Patient presents with  . Dental Pain   (Consider location/radiation/quality/duration/timing/severity/associated sxs/prior Treatment) HPI Comments: Pateint with large cavity in R lower second molar for a while has been treated with antibiotics and pain control has seen DDS but referred to oral surgeon that she can not afford Now with increased pain   Patient is a 27 y.o. female presenting with tooth pain. The history is provided by the patient.  Dental Pain Location:  Lower Lower teeth location:  31/RL 2nd molar Quality:  Aching Severity:  Moderate Onset quality:  Gradual Timing:  Constant Progression:  Unchanged Chronicity:  Recurrent Context: dental caries   Associated symptoms: no facial swelling, no fever and no headaches     Past Medical History  Diagnosis Date  . Pregnancy induced hypertension   . Anxiety    Past Surgical History  Procedure Laterality Date  . No past surgeries     Family History  Problem Relation Age of Onset  . Asthma Mother   . Diabetes Maternal Grandmother   . Hypertension Maternal Grandmother   . Asthma Maternal Grandfather   . Anesthesia problems Neg Hx    History  Substance Use Topics  . Smoking status: Never Smoker   . Smokeless tobacco: Never Used  . Alcohol Use: No   OB History   Grav Para Term Preterm Abortions TAB SAB Ect Mult Living   3 3 3  0 0 0 0 0 0 3     Review of Systems  Constitutional: Negative for fever and chills.  HENT: Positive for dental problem. Negative for facial swelling.   Neurological: Negative for headaches.  All other systems reviewed and are negative.    Allergies  Latex  Home Medications   Current Outpatient Rx  Name  Route  Sig  Dispense  Refill  . Naproxen Sodium (ALEVE PO)   Oral   Take 2 tablets by mouth daily as needed (for pain).         .  Prenatal Vit-Fe Fumarate-FA (PRENATAL MULTIVITAMIN) TABS   Oral   Take 1 tablet by mouth daily at 12 noon.         . penicillin v potassium (VEETID) 500 MG tablet   Oral   Take 1 tablet (500 mg total) by mouth 4 (four) times daily.   39 tablet   0   . traMADol (ULTRAM) 50 MG tablet   Oral   Take 1 tablet (50 mg total) by mouth every 6 (six) hours as needed.   30 tablet   0    BP 134/83  Pulse 66  Temp(Src) 97.6 F (36.4 C) (Oral)  Resp 18  Wt 175 lb (79.379 kg)  SpO2 100% Physical Exam  Nursing note and vitals reviewed. Constitutional: She appears well-developed and well-nourished.  HENT:  Head: Normocephalic.  Mouth/Throat:    Eyes: Pupils are equal, round, and reactive to light.  Neck: Normal range of motion.  Lymphadenopathy:    She has no cervical adenopathy.    ED Course  Procedures (including critical care time) Labs Review Labs Reviewed - No data to display Imaging Review No results found.  EKG Interpretation   None       MDM   1. Dental cavity    Will treat with Penicillin and Ultram     Arman Filter,  NP 05/17/13 0125  Arman Filter, NP 05/17/13 [phone]

## 2013-05-17 NOTE — ED Notes (Signed)
Presents with lower right back tooth pain began this evening. Requesting medication for pain and antibiotics.  Was told she needs to have tooth removed by her dentist.

## 2013-11-08 ENCOUNTER — Emergency Department (HOSPITAL_COMMUNITY)
Admission: EM | Admit: 2013-11-08 | Discharge: 2013-11-08 | Disposition: A | Discharge status: Home / Self Care | Payer: 59 | Source: Home / Self Care

## 2013-12-01 ENCOUNTER — Emergency Department (HOSPITAL_COMMUNITY): Admission: EM | Admit: 2013-12-01 | Discharge: 2013-12-01 | Discharge status: Against Medical Advice | Payer: 59

## 2014-04-25 ENCOUNTER — Encounter (HOSPITAL_COMMUNITY): Payer: Self-pay | Admitting: Emergency Medicine

## 2015-11-07 ENCOUNTER — Emergency Department (HOSPITAL_COMMUNITY)
Admission: EM | Admit: 2015-11-07 | Discharge: 2015-11-08 | Disposition: A | Discharge status: Home / Self Care | Payer: 59 | Attending: Emergency Medicine | Admitting: Emergency Medicine

## 2015-11-07 DIAGNOSIS — Z79899 Other long term (current) drug therapy: Secondary | ICD-10-CM | POA: Insufficient documentation

## 2015-11-07 DIAGNOSIS — K429 Umbilical hernia without obstruction or gangrene: Secondary | ICD-10-CM | POA: Insufficient documentation

## 2015-11-07 DIAGNOSIS — Z791 Long term (current) use of non-steroidal anti-inflammatories (NSAID): Secondary | ICD-10-CM | POA: Insufficient documentation

## 2015-11-07 DIAGNOSIS — Z79891 Long term (current) use of opiate analgesic: Secondary | ICD-10-CM | POA: Insufficient documentation

## 2015-11-07 DIAGNOSIS — Z91040 Latex allergy status: Secondary | ICD-10-CM | POA: Insufficient documentation

## 2015-11-07 DIAGNOSIS — Z792 Long term (current) use of antibiotics: Secondary | ICD-10-CM | POA: Diagnosis not present

## 2015-11-07 DIAGNOSIS — K469 Unspecified abdominal hernia without obstruction or gangrene: Secondary | ICD-10-CM | POA: Diagnosis present

## 2015-11-07 HISTORY — DX: Unspecified abdominal hernia without obstruction or gangrene: K46.9

## 2015-11-08 ENCOUNTER — Encounter (HOSPITAL_COMMUNITY): Payer: Self-pay | Admitting: Family Medicine

## 2015-11-08 NOTE — ED Notes (Signed)
Patient reports she has a hernia that protruded on Friday after coughing and has not resolved.

## 2015-11-08 NOTE — ED Provider Notes (Signed)
CSN: [id number]     Arrival date & time 11/07/15  2350 History   First MD Initiated Contact with Patient 11/08/15 0315     Chief Complaint  Patient presents with  . Hernia     (Consider location/radiation/quality/duration/timing/severity/associated sxs/prior Treatment) HPI Comments: 30 year old female presents to the emergency department for evaluation of abdominal pain associated with a hernia. Patient states that she has had a hernia for 9 years. It has worsened with her pregnancies. She recently had an upper respiratory infection which included coughing. She states that her cough has remained despite resolution of her other symptoms. She noticed that forceful coughing caused her hernia to worsen. She states that she has not been able to reduce her hernia over the past 3 days. She has had some associated discomfort at the site of her hernia as a result. She states that the pain worsened this morning. She has not taken any medications for her symptoms. It has been aggravated slightly with eating or drinking. She has never seen a Careers adviser for follow-up. No history of abdominal surgeries.  The history is provided by the patient. No language interpreter was used.    Past Medical History  Diagnosis Date  . Pregnancy induced hypertension   . Anxiety   . Abdominal hernia    Past Surgical History  Procedure Laterality Date  . No past surgeries     Family History  Problem Relation Age of Onset  . Asthma Mother   . Diabetes Maternal Grandmother   . Hypertension Maternal Grandmother   . Asthma Maternal Grandfather   . Anesthesia problems Neg Hx    Social History  Substance Use Topics  . Smoking status: Never Smoker   . Smokeless tobacco: Never Used  . Alcohol Use: No   OB History    Gravida Para Term Preterm AB TAB SAB Ectopic Multiple Living   0 0 0 0 0 0 3      Review of Systems  Constitutional: Negative for fever.  Gastrointestinal: Positive for abdominal pain. Negative  for vomiting and blood in stool.  All other systems reviewed and are negative.   Allergies  Latex  Home Medications   Prior to Admission medications   Medication Sig Start Date End Date Taking? Authorizing Provider  Naproxen Sodium (ALEVE PO) Take 2 tablets by mouth daily as needed (for pain).    Historical Provider, MD  penicillin v potassium (VEETID) 500 MG tablet Take 1 tablet (500 mg total) by mouth 4 (four) times daily. 05/17/13   Earley Favor, NP  Prenatal Vit-Fe Fumarate-FA (PRENATAL MULTIVITAMIN) TABS Take 1 tablet by mouth daily at 12 noon.    Historical Provider, MD  traMADol (ULTRAM) 50 MG tablet Take 1 tablet (50 mg total) by mouth every 6 (six) hours as needed. 05/17/13   Earley Favor, NP   BP 118/69 mmHg  Pulse 64  Temp(Src) 98.9 F (37.2 C) (Oral)  Resp 18  Ht  (1.6 m)  Wt 72.576 kg  BMI 28.35 kg/m2  SpO2 99%  LMP 10/13/2015   Physical Exam  Constitutional: She is oriented to person, place, and time. She appears well-developed and well-nourished. No distress.  Nontoxic appearing. In no acute distress.  HENT:  Head: Normocephalic and atraumatic.  Eyes: Conjunctivae and EOM are normal. No scleral icterus.  Neck: Normal range of motion.  Pulmonary/Chest: Effort normal. No respiratory distress. She has no wheezes.  Respirations even and unlabored  Abdominal: Soft. She exhibits no distension. There  is no rebound and no guarding.  Soft supraumbilical midline hernia. No overlying erythema or heat to touch. Hernia is reducible on palpation.  Musculoskeletal: Normal range of motion.  Neurological: She is alert and oriented to person, place, and time. She exhibits normal muscle tone. Coordination normal.  GCS 15. Patient moving all extremities.  Skin: Skin is warm and dry. No rash noted. She is not diaphoretic. No erythema. No pallor.  Psychiatric: She has a normal mood and affect. Her behavior is normal.  Nursing note and vitals reviewed.   ED Course   Procedures (including critical care time) Labs Review Labs Reviewed - No data to display  Imaging Review No results found.   I have personally reviewed and evaluated these images and lab results as part of my medical decision-making.   EKG Interpretation None      MDM   Final diagnoses:  Recurrent umbilical hernia    30 year old female with a history of abdominal hernia 9 years presents to the emergency department today for worsening pain at her hernia site. She reports being unable to reduce her hernia for 3 days. She is afebrile today. Hernia soft and without erythema or heat to touch on exam. It was able to be reduced on the first attempt at the bedside. This relieved the patient's discomfort. Doubt strangulated or incarcerated hernia given resolution of pain following reduction of hernia. Patient was given a hernia belt for stability. She was also given a prescription for cough medication given her recent upper respiratory infection. Patient referred to general surgery for follow-up should she choose to undergo elective hernia repair. Return precautions discussed. Patient discharged in satisfactory condition with no unaddressed concerns.   Filed Vitals:   11/08/15 0003 11/08/15 0115  BP: 147/86 118/69  Pulse: 89 64  Temp: 98.9 F (37.2 C)   TempSrc: Oral   Resp: 18   Height: 5\' 3"  (1.6 m)   Weight: 72.576 kg   SpO2: 100% 99%     Antony MaduraKelly Latiesha Harada, PA-C 11/08/15 0551  Dione Boozeavid Glick, MD 11/08/15 [phone]

## 2015-11-08 NOTE — ED Notes (Signed)
PA at bedside.

## 2016-03-22 ENCOUNTER — Ambulatory Visit: Payer: 59

## 2016-03-22 NOTE — Progress Notes (Unsigned)
   Subjective:    Patient ID: [name], female    DOB: 09/06/1985, 30 y.o.   MRN: [id number]  HPI    Review of Systems  Constitutional: Negative.   HENT: Negative.   Eyes: Negative.   Respiratory: Negative.   Cardiovascular: Negative.   Gastrointestinal: Negative.   Endocrine: Negative.   Genitourinary: Negative.   Musculoskeletal: Negative.   Skin: Negative.   Allergic/Immunologic: Negative.   Neurological: Negative.   Hematological: Negative.   Psychiatric/Behavioral: Negative.        Objective:   Physical Exam        Assessment & Plan:

## 2016-03-22 NOTE — Patient Instructions (Signed)
     IF you received an x-ray today, you will receive an invoice from [facility]. Please contact [facility] at [phone] with questions or concerns regarding your invoice.   IF you received labwork today, you will receive an invoice from [facility]. Please contact [facility] at [phone] with questions or concerns regarding your invoice.   Our billing staff will not be able to assist you with questions regarding bills from these companies.  You will be contacted with the lab results as soon as they are available. The fastest way to get your results is to activate your My Chart account. Instructions are located on the last page of this paperwork. If you have not heard from us regarding the results in 2 weeks, please contact this office.      

## 2016-03-23 ENCOUNTER — Ambulatory Visit (INDEPENDENT_AMBULATORY_CARE_PROVIDER_SITE_OTHER): Payer: 59 | Admitting: Family Medicine

## 2016-03-23 VITALS — BP 140/86 | HR 87 | Temp 97.7°F | Resp 17 | Ht 63.5 in | Wt 162.2 lb

## 2016-03-23 DIAGNOSIS — Z0000 Encounter for general adult medical examination without abnormal findings: Secondary | ICD-10-CM

## 2016-03-23 DIAGNOSIS — Z131 Encounter for screening for diabetes mellitus: Secondary | ICD-10-CM

## 2016-03-23 DIAGNOSIS — Z136 Encounter for screening for cardiovascular disorders: Secondary | ICD-10-CM

## 2016-03-23 LAB — CBC WITH DIFFERENTIAL/PLATELET
BASOS ABS: 0 {cells}/uL (ref 0–200)
Basophils Relative: 0 %
EOS ABS: 522 {cells}/uL — AB (ref 15–500)
EOS PCT: 6 %
HCT: 39.6 % (ref 35.0–45.0)
HEMOGLOBIN: 13.4 g/dL (ref 11.7–15.5)
LYMPHS ABS: 2610 {cells}/uL (ref 850–3900)
Lymphocytes Relative: 30 %
MCH: 29.1 pg (ref 27.0–33.0)
MCHC: 33.8 g/dL (ref 32.0–36.0)
MCV: 85.9 fL (ref 80.0–100.0)
MONO ABS: 261 {cells}/uL (ref 200–950)
MPV: 10.9 fL (ref 7.5–12.5)
Monocytes Relative: 3 %
NEUTROS ABS: 5307 {cells}/uL (ref 1500–7800)
Neutrophils Relative %: 61 %
Platelets: 270 10*3/uL (ref 140–400)
RBC: 4.61 MIL/uL (ref 3.80–5.10)
RDW: 13 % (ref 11.0–15.0)
WBC: 8.7 10*3/uL (ref 3.8–10.8)

## 2016-03-23 LAB — COMPLETE METABOLIC PANEL WITH GFR
ALBUMIN: 4.1 g/dL (ref 3.6–5.1)
ALK PHOS: 66 U/L (ref 33–115)
ALT: 11 U/L (ref 6–29)
AST: 14 U/L (ref 10–30)
BILIRUBIN TOTAL: 0.4 mg/dL (ref 0.2–1.2)
BUN: 7 mg/dL (ref 7–25)
CO2: 23 mmol/L (ref 20–31)
Calcium: 9.5 mg/dL (ref 8.6–10.2)
Chloride: 105 mmol/L (ref 98–110)
Creat: 0.6 mg/dL (ref 0.50–1.10)
GFR, Est African American: >89 mL/min (ref >=60)
GLUCOSE: 75 mg/dL (ref 65–99)
Potassium: 3.9 mmol/L (ref 3.5–5.3)
SODIUM: 139 mmol/L (ref 135–146)
TOTAL PROTEIN: 6.9 g/dL (ref 6.1–8.1)

## 2016-03-23 LAB — LIPID PANEL
CHOLESTEROL: 148 mg/dL (ref 125–200)
HDL: 47 mg/dL (ref >=46)
LDL Cholesterol: 91 mg/dL (ref <130)
TRIGLYCERIDES: 49 mg/dL (ref <150)
Total CHOL/HDL Ratio: 3.1 Ratio (ref <=5.0)
VLDL: 10 mg/dL (ref <30)

## 2016-03-23 NOTE — Progress Notes (Signed)
Patient ID: [name], female    DOB: 15-Aug-1985, 30 y.o.   MRN: [id number]  PCP: Michele Mcalpine, MD  Chief Complaint  Patient presents with  . Annual Exam    [employer]     Subjective:   HPI 30 year old female, presents for a complete physical exam. She would like to defer the gynecological exam today due to time constraints. She works from home as a Engineer, building services for [employer]. Reports generalized good health.  She has been attempted different lifestyle changes to aid in weight loss.  HEENT No routine dental and vision exams No issues or concerns within this systems.  Cardiovascular and lungs  Denies chest pain, palpations, fatigue, or chronic cough   Breasts Performs routine self breast exams.   Abdominal Denies cramping, irregular bowel patterns, heartburn, nausea, or diarrhea.  Genitourinary  Denies urinary frequency, retention, or vaginal discharge.  Reports in monogamous relationship.  Musculoskeletal  Denies any associated pain or problem within this system.   Neurological  Hx of migraine headaches relieved by over the counter Excedrin.  Psychological Health  Denies anxiety, depression, or sleep disturbances   Review of Systems See HPI Patient Active Problem List   Diagnosis Date Noted  . Normal pregnancy 01/07/2013  . SVD (spontaneous vaginal delivery) 08/20/2011  . Gestational hypertension 08/19/2011  . CONSTIPATION 03/06/2009  . PAIN IN JOINT, LOWER LEG 03/06/2009  . FATIGUE 03/06/2009  . WEIGHT GAIN, ABNORMAL 03/06/2009  . ABDOMINAL PAIN OTHER SPECIFIED SITE 03/28/2008  . HEADACHE 03/23/2008  . MIGRAINE HEADACHE 06/29/2007  . HYPERTENSION, BENIGN ESSENTIAL 06/29/2007  . UTI'S, RECURRENT 06/29/2007  . ACNE, MILD 06/26/2007     Prior to Admission medications   Medication Sig Start Date End Date Taking? Authorizing Provider  Naproxen Sodium (ALEVE PO) Take 2 tablets by mouth daily as needed  (for pain).   Yes Historical Provider, MD  Prenatal Vit-Fe Fumarate-FA (PRENATAL MULTIVITAMIN) TABS Take 1 tablet by mouth daily at 12 noon.    Historical Provider, MD  traMADol (ULTRAM) 50 MG tablet Take 1 tablet (50 mg total) by mouth every 6 (six) hours as needed. Patient not taking: Reported on 03/23/2016 05/17/13   Earley Favor, NP   Allergies  Allergen Reactions  . Latex Hives, Itching and Rash     Objective:  Physical Exam  Constitutional: She is oriented to person, place, and time. She appears well-developed and well-nourished.  HENT:  Head: Normocephalic and atraumatic.  Right Ear: External ear normal.  Left Ear: External ear normal.  Nose: Nose normal.  Mouth/Throat: Oropharynx is clear and moist.  Eyes: Conjunctivae and EOM are normal. Pupils are equal, round, and reactive to light.  Neck: Normal range of motion. Neck supple.  Cardiovascular: Normal rate, regular rhythm, normal heart sounds and intact distal pulses.   Pulmonary/Chest: Effort normal and breath sounds normal.  Abdominal: Soft. Bowel sounds are normal.  Musculoskeletal: Normal range of motion.  Neurological: She is alert and oriented to person, place, and time. She has normal reflexes.  Skin: Skin is warm and dry.  Psychiatric: She has a normal mood and affect. Her behavior is normal. Judgment and thought content normal.   Vitals:   03/23/16 1413  BP: 140/86  Pulse: 87  Resp: 17  Temp: 97.7 F (36.5 C)   Assessment & Plan:  1. Encounter for annual physical exam Age-appropriate anticipatory guidance provided  2. Screening for cardiovascular condition - Lipid panel - CBC with Differential -  COMPLETE METABOLIC PANEL WITH GFR  3. Screening for diabetes mellitus - Hemoglobin A1c  Patient presents for a physical examination and request completion of biometric form which is required by her employer [employer]. She will return for gynecological exam.   Follow-up to complete your gynecological  portion of your exam. Obtain a vision exam from optometrist. Increase physical activity to increase weight loss which will reduce blood pressure.  Godfrey PickKimberly S. Tiburcio PeaHarris, MSN, FNP-C [facility]

## 2016-03-23 NOTE — Patient Instructions (Addendum)
Nice meeting you today!  Your forms will be ready for pick-up 03/26/16 after 10:00 am.  Schedule a follow-up visit to complete your gynecological exam.   IF you received an x-ray today, you will receive an invoice from [facility]. Please contact [facility] at [phone] with questions or concerns regarding your invoice.   IF you received labwork today, you will receive an invoice from [employer]. Please contact [facility] at [phone] with questions or concerns regarding your invoice.   Our billing staff will not be able to assist you with questions regarding bills from these companies.  You will be contacted with the lab results as soon as they are available. The fastest way to get your results is to activate your My Chart account. Instructions are located on the last page of this paperwork. If you have not heard from [place] regarding the results in 2 weeks, please contact this office.    Exercising to Stay Healthy Exercising regularly is important. It has many health benefits, such as:  Improving your overall fitness, flexibility, and endurance.  Increasing your bone density.  Helping with weight control.  Decreasing your body fat.  Increasing your muscle strength.  Reducing stress and tension.  Improving your overall health. In order to become healthy and stay healthy, it is recommended that you do moderate-intensity and vigorous-intensity exercise. You can tell that you are exercising at a moderate intensity if you have a higher heart rate and faster breathing, but you are still able to hold a conversation. You can tell that you are exercising at a vigorous intensity if you are breathing much harder and faster and cannot hold a conversation while exercising. HOW OFTEN SHOULD I EXERCISE? Choose an activity that you enjoy and set realistic goals. Your health care provider can help you to make an activity plan that works for you.  Exercise regularly as directed by your health care provider. This may include:   Doing resistance training twice each week, such as:  Push-ups.  Sit-ups.  Lifting weights.  Using resistance bands.  Doing a given intensity of exercise for a given amount of time. Choose from these options:  150 minutes of moderate-intensity exercise every week.  75 minutes of vigorous-intensity exercise every week.  A mix of moderate-intensity and vigorous-intensity exercise every week. Children, pregnant women, people who are out of shape, people who are overweight, and older adults may need to consult a health care provider for individual recommendations. If you have any sort of medical condition, be sure to consult your health care provider before starting a new exercise program.  WHAT ARE SOME EXERCISE IDEAS? Some moderate-intensity exercise ideas include:   Walking at a rate of 1 mile in 15 minutes.  Biking.  Hiking.  Golfing.  Dancing. Some vigorous-intensity exercise ideas include:   Walking at a rate of at least 4.5 miles per hour.  Jogging or running at a rate of 5 miles per hour.  Biking at a rate of at least 10 miles per hour.  Lap swimming.  Roller-skating or in-line skating.  Cross-country skiing.  Vigorous competitive sports, such as football, basketball, and soccer.  Jumping rope.  Aerobic dancing. WHAT ARE SOME EVERYDAY ACTIVITIES THAT CAN HELP ME TO GET EXERCISE?  Yard work, such as:  Child psychotherapist.  Raking and bagging leaves.  Washing and waxing your car.  Pushing a stroller.  Shoveling snow.  Gardening.  Washing windows or floors. HOW CAN I BE MORE ACTIVE IN  MY DAY-TO-DAY ACTIVITIES?  Use the stairs instead of the elevator.  Take a walk during your lunch break.  If you drive, park your car farther away from work or school.  If you take public transportation, get off one stop early and walk the rest of the way.  Make all of your  phone calls while standing up and walking around.  Get up, stretch, and walk around every 30 minutes throughout the day. WHAT GUIDELINES SHOULD I FOLLOW WHILE EXERCISING?  Do not exercise so much that you hurt yourself, feel dizzy, or get very short of breath.  Consult your health care provider before starting a new exercise program.  Wear comfortable clothes and shoes with good support.  Drink plenty of water while you exercise to prevent dehydration or heat stroke. Body water is lost during exercise and must be replaced.  Work out until you breathe faster and your heart beats faster.   This information is not intended to replace advice given to you by your health care provider. Make sure you discuss any questions you have with your health care provider.   Document Released: 07/13/2010 Document Revised: 07/01/2014 Document Reviewed: 11/11/2013 Elsevier Interactive Patient Education [employer]2016 Elsevier Inc.

## 2016-03-24 LAB — HEMOGLOBIN A1C
HEMOGLOBIN A1C: 4.5 % (ref <5.7)
MEAN PLASMA GLUCOSE: 82 mg/dL

## 2016-03-27 ENCOUNTER — Encounter: Payer: Self-pay | Admitting: Family Medicine

## 2016-03-27 NOTE — Progress Notes (Signed)
ctober 4, 2017   [name] [address] [place] [address]   Dear Ms. Capece,  Below are the results from your recent visit and they were as expected.  Resulted Orders  Hemoglobin A1c  Result Value Ref Range   Hgb A1c MFr Bld 4.5 <5.7 %     Comment:       For the purpose of screening for the presence of diabetes:   <5.7%       Consistent with the absence of diabetes 5.7-6.4 %   Consistent with increased risk for diabetes (prediabetes) >=6.5 %     Consistent with diabetes   This assay result is consistent with a decreased risk of diabetes.   Currently, no consensus exists regarding use of hemoglobin A1c for diagnosis of diabetes in children.   According to American Diabetes Association (ADA) guidelines, hemoglobin A1c <7.0% represents optimal control in non-pregnant diabetic patients. Different metrics may apply to specific patient populations. Standards of Medical Care in Diabetes (ADA).      Mean Plasma Glucose 82 mg/dL   Narrative   Performed at:  [address], [address]                [place], [place] [address]  Lipid panel  Result Value Ref Range   Cholesterol 148 125 - 200 mg/dL   Triglycerides 49 <150 mg/dL   HDL 47 >=46 mg/dL   Total CHOL/HDL Ratio 3.1 <=5.0 Ratio   VLDL 10 <30 mg/dL   LDL Cholesterol 91 <130 mg/dL     Comment:       Total Cholesterol/HDL Ratio:CHD Risk                        Coronary Heart Disease Risk Table                                        Men       Women          1/2 Average Risk              3.4        3.3              Average Risk              5.0        4.4           2X Average Risk              9.6        7.1           3X Average Risk             23.4       11.0 Use the calculated Patient Ratio above and the CHD Risk table  to determine the patient's CHD Risk.    Narrative   Performed at:  [address], [address]  [place], [place] [address]  CBC with Differential  Result Value Ref Range   WBC 8.7 3.8 - 10.8 K/uL   RBC 4.61 3.80 - 5.10 MIL/uL   Hemoglobin 13.4 11.7 - 15.5 g/dL   HCT  39.6 35.0 - 45.0 %   MCV 85.9 80.0 - 100.0 fL   MCH 29.1 27.0 - 33.0 pg   MCHC 33.8 32.0 - 36.0 g/dL   RDW 13.0 11.0 - 15.0 %   Platelets 270 140 - 400 K/uL   MPV 10.9 7.5 - 12.5 fL   Neutro Abs 5,307 1,500 - 7,800 cells/uL   Lymphs Abs 2,610 850 - 3,900 cells/uL   Monocytes Absolute 261 200 - 950 cells/uL   Eosinophils Absolute 522 (H) 15 - 500 cells/uL   Basophils Absolute 0 0 - 200 cells/uL   Neutrophils Relative % 61 %   Lymphocytes Relative 30 %   Monocytes Relative 3 %   Eosinophils Relative 6 %   Basophils Relative 0 %   Smear Review Criteria for review not met    Narrative   Performed at:  [facility]                [address], [address]                [place], [place] [address]  COMPLETE METABOLIC PANEL WITH GFR  Result Value Ref Range   Sodium 139 135 - 146 mmol/L   Potassium 3.9 3.5 - 5.3 mmol/L   Chloride 105 98 - 110 mmol/L   CO2 23 20 - 31 mmol/L   Glucose, Bld 75 65 - 99 mg/dL   BUN 7 7 - 25 mg/dL   Creat 0.60 0.50 - 1.10 mg/dL   Total Bilirubin 0.4 0.2 - 1.2 mg/dL   Alkaline Phosphatase 66 33 - 115 U/L   AST 14 10 - 30 U/L   ALT 11 6 - 29 U/L   Total Protein 6.9 6.1 - 8.1 g/dL   Albumin 4.1 3.6 - 5.1 g/dL   Calcium 9.5 8.6 - 10.2 mg/dL   GFR, Est African American >89 >=60 mL/min   GFR, Est Non African American >89 >=60 mL/min   Narrative   Performed at:  [address], [address]                [place], [place] [address]     If you have any questions or concerns, please don't hesitate to call.  Sincerely,   Molli Barrows, FNP

## 2016-04-08 ENCOUNTER — Ambulatory Visit (INDEPENDENT_AMBULATORY_CARE_PROVIDER_SITE_OTHER): Payer: 59 | Admitting: Family Medicine

## 2016-04-08 ENCOUNTER — Ambulatory Visit (INDEPENDENT_AMBULATORY_CARE_PROVIDER_SITE_OTHER): Payer: 59

## 2016-04-08 VITALS — BP 118/82 | HR 83 | Temp 98.7°F | Resp 18 | Ht 63.5 in | Wt 163.6 lb

## 2016-04-08 DIAGNOSIS — R12 Heartburn: Secondary | ICD-10-CM | POA: Diagnosis not present

## 2016-04-08 DIAGNOSIS — R079 Chest pain, unspecified: Secondary | ICD-10-CM

## 2016-04-08 DIAGNOSIS — K429 Umbilical hernia without obstruction or gangrene: Secondary | ICD-10-CM | POA: Diagnosis not present

## 2016-04-08 DIAGNOSIS — K5900 Constipation, unspecified: Secondary | ICD-10-CM

## 2016-04-08 LAB — POCT CBC
GRANULOCYTE PERCENT: 64.2 % (ref 37–80)
HEMATOCRIT: 37.2 % — AB (ref 37.7–47.9)
Hemoglobin: 13.1 g/dL (ref 12.2–16.2)
Lymph, poc: 2.4 (ref 0.6–3.4)
MCH: 29.9 pg (ref 27–31.2)
MCHC: 35.1 g/dL (ref 31.8–35.4)
MCV: 85 fL (ref 80–97)
MID (CBC): 0.5 (ref 0–0.9)
MPV: 8.3 fL (ref 0–99.8)
POC GRANULOCYTE: 5.1 (ref 2–6.9)
POC LYMPH %: 29.5 % (ref 10–50)
POC MID %: 6.3 % (ref 0–12)
Platelet Count, POC: 257 10*3/uL (ref 142–424)
RBC: 4.38 M/uL (ref 4.04–5.48)
RDW, POC: 12.7 %
WBC: 8 10*3/uL (ref 4.6–10.2)

## 2016-04-08 LAB — TSH: TSH: 0.55 m[IU]/L

## 2016-04-08 MED ORDER — SUCRALFATE 1 GM/10ML PO SUSP: 1.0000 g | Freq: Three times a day (TID) | ORAL | 0 refills | Status: AC

## 2016-04-08 MED ORDER — OMEPRAZOLE 20 MG PO CPDR: 20.0000 mg | DELAYED_RELEASE_CAPSULE | Freq: Every day | ORAL | 1 refills | Status: AC

## 2016-04-08 NOTE — Progress Notes (Signed)
Subjective:  By signing my name below, I, [name], attest that this documentation has been prepared under the direction and in the presence of Meredith Staggers, MD. Electronically Signed: Stann Bates, Scribe. 04/08/2016 , 12:15 PM .  Patient was seen in Room 14 .   Patient ID: [name], female    DOB: March 14, 1986, 30 y.o.   MRN: [id number] Chief Complaint  Patient presents with  . Heartburn    x2-3 days   HPI [name] is a 30 y.o. female Recently had annual exam on Sept 30th.   Patient states she's been having upper mid chest pain, radiating through into her back, ongoing for about a month now. It's been causing her to stay up at night. She's been trying to burp and belch up, but isn't able to. She notes that any kind of food, even chewing gum, aggravates her chest pain. She denies shoulder pain. She's tried taking Gas-X and Tums without relief.   She also reports having constipation ongoing for a few months. She describes it "feeling like she's suffocating when she's laying down, like a rubber band around her abdomen". She has a bowel movement about every 3rd day. She has taken Golytely for relief. She denies having a thyroid testing. She denies fever. She drinks alcohol about twice a month. Her periods have been normal. She denies smoking.   She also mentions an umbilical hernia since giving birth to her first child. Her OBGYN informed her that it'll eventually resolve and go away. But, patient notices it return when she's constipated or after eating. Her last pregnancy was on July 14th.   Patient Active Problem List   Diagnosis Date Noted  . Normal pregnancy 01/07/2013  . SVD (spontaneous vaginal delivery) 08/20/2011  . Gestational hypertension 08/19/2011  . CONSTIPATION 03/06/2009  . PAIN IN JOINT, LOWER LEG 03/06/2009  . FATIGUE 03/06/2009  . WEIGHT GAIN, ABNORMAL 03/06/2009  . ABDOMINAL PAIN OTHER SPECIFIED SITE 03/28/2008  . HEADACHE 03/23/2008  .  MIGRAINE HEADACHE 06/29/2007  . HYPERTENSION, BENIGN ESSENTIAL 06/29/2007  . UTI'S, RECURRENT 06/29/2007  . ACNE, MILD 06/26/2007   Past Medical History:  Diagnosis Date  . Abdominal hernia   . Anxiety   . Pregnancy induced hypertension    Past Surgical History:  Procedure Laterality Date  . NO PAST SURGERIES     Allergies  Allergen Reactions  . Latex Hives, Itching and Rash   Prior to Admission medications   Medication Sig Start Date End Date Taking? Authorizing Provider  Naproxen Sodium (ALEVE PO) Take 2 tablets by mouth daily as needed (for pain).    Historical Provider, MD  Prenatal Vit-Fe Fumarate-FA (PRENATAL MULTIVITAMIN) TABS Take 1 tablet by mouth daily at 12 noon.    Historical Provider, MD  traMADol (ULTRAM) 50 MG tablet Take 1 tablet (50 mg total) by mouth every 6 (six) hours as needed. Patient not taking: Reported on 04/08/2016 05/17/13   Earley Favor, NP   Social History   Social History  . Marital status: Single    Spouse name: N/A  . Number of children: N/A  . Years of education: N/A   Occupational History  . Not on file.   Social History Main Topics  . Smoking status: Never Smoker  . Smokeless tobacco: Never Used  . Alcohol use No  . Drug use: No  . Sexual activity: Yes    Birth control/ protection: None     Comment: Just stopped breast feeding   Other  Topics Concern  . Not on file   Social History Narrative  . No narrative on file   Review of Systems  Constitutional: Negative for appetite change, chills, fatigue, fever and unexpected weight change.  Respiratory: Positive for chest tightness. Negative for cough.   Gastrointestinal: Positive for abdominal distention and constipation. Negative for diarrhea, nausea and vomiting.  Musculoskeletal: Positive for back pain. Negative for arthralgias and myalgias.  Skin: Negative for rash and wound.  Neurological: Negative for dizziness, weakness and headaches.       Objective:   Physical Exam    Constitutional: She is oriented to person, place, and time. She appears well-developed and well-nourished. No distress.  HENT:  Head: Normocephalic and atraumatic.  Eyes: EOM are normal. Pupils are equal, round, and reactive to light.  Neck: Neck supple.  Cardiovascular: Normal rate.   Pulmonary/Chest: Effort normal. No respiratory distress.  Abdominal: Soft. Bowel sounds are normal. She exhibits no distension.  Reducible hernia just above the umbilicus  Musculoskeletal: Normal range of motion.  Neurological: She is alert and oriented to person, place, and time.  Skin: Skin is warm and dry.  Psychiatric: She has a normal mood and affect. Her behavior is normal.  Nursing note and vitals reviewed.   Vitals:   04/08/16 1119  BP: 118/82  Pulse: 83  Resp: 18  Temp: 98.7 F (37.1 C)  TempSrc: Oral  SpO2: 100%  Weight: 163 lb 9.6 oz (74.2 kg)  Height: 5' 3.5" (1.613 m)   Dg Chest 2 View  Result Date: 04/08/2016 CLINICAL DATA:  Chest pain radiating to back and heartburn for 1 month. EXAM: CHEST  2 VIEW COMPARISON:  None. FINDINGS: The heart size and mediastinal contours are within normal limits. Both lungs are clear. The visualized skeletal structures are unremarkable. IMPRESSION: Negative.  No active cardiopulmonary disease. Electronically Signed   By: Myles Rosenthal M.D.   On: 04/08/2016 12:32   EKG: sinus rhythm, no acute findings  Results for orders placed or performed in visit on 04/08/16  POCT CBC  Result Value Ref Range   WBC 8.0 4.6 - 10.2 K/uL   Lymph, poc 2.4 0.6 - 3.4   POC LYMPH PERCENT 29.5 10 - 50 %L   MID (cbc) 0.5 0 - 0.9   POC MID % 6.3 0 - 12 %M   POC Granulocyte 5.1 2 - 6.9   Granulocyte percent 64.2 37 - 80 %G   RBC 4.38 4.04 - 5.48 M/uL   Hemoglobin 13.1 12.2 - 16.2 g/dL   HCT, POC 16.1 (A) 09.6 - 47.9 %   MCV 85.0 80 - 97 fL   MCH, POC 29.9 27 - 31.2 pg   MCHC 35.1 31.8 - 35.4 g/dL   RDW, POC 04.5 %   Platelet Count, POC 257 142 - 424 K/uL   MPV 8.3 0  - 99.8 fL      Assessment & Plan:    [name] is a 30 y.o. female Umbilical hernia without obstruction and without gangrene - Plan: Ambulatory referral to General Surgery  - persistent midline hernia. eval with gen surgery, hernia handout and precautions given.   Constipation, unspecified constipation type - Plan: TSH, Ambulatory referral to Gastroenterology  - stool softener, fiber and fluid in diet, but due to chronicity, will check TSH and refer to GI.   Heartburn - Plan: Ambulatory referral to Gastroenterology, sucralfate (CARAFATE) 1 GM/10ML suspension, omeprazole (PRILOSEC) 20 MG capsule, Chest pain, unspecified type - Plan: Ambulatory referral  to Gastroenterology, DG Chest 2 View, EKG 12-Lead, POCT CBC  - suspected GERD vs esophageal spasm vs achalasia. Radiation to back.Tolerating fluids/po. ASX in office.   - reassuring CXR, CBC, and no acute findings on EKG.   - start carafate, PPI, bland diet/fluids and refer to GI.   -ER/RTC precautions.   Meds ordered this encounter  Medications  . sucralfate (CARAFATE) 1 GM/10ML suspension    Sig: Take 10 mLs (1 g total) by mouth 4 (four) times daily -  with meals and at bedtime.    Dispense:  420 mL    Refill:  0  . omeprazole (PRILOSEC) 20 MG capsule    Sig: Take 1 capsule (20 mg total) by mouth daily.    Dispense:  30 capsule    Refill:  1   Patient Instructions   I will refer you to gastroenterology to discuss your suspected heartburn symptoms, but sometimes the symptoms can come from the esophagus not relaxing enough to allow food to pass. This can also be evaluated with gastroenterology. For now avoid spicy foods fried foods and bland soft diet if possible. If you are unable to pass food, especially fluids, return here or the emergency room. If any worsening of your symptoms, including shortness of breath with chest pain, be seen in the emergency room.  See information below on constipation. Make sure you're drinking  plenty of fluids, fiber in the diet.  I also referred you to surgeon to discuss the hernia and whether it needs to be repaired. See precautions below  Food Choices for Gastroesophageal Reflux Disease, Adult When you have gastroesophageal reflux disease (GERD), the foods you eat and your eating habits are very important. Choosing the right foods can help ease the discomfort of GERD. WHAT GENERAL GUIDELINES DO I NEED TO FOLLOW?  Choose fruits, vegetables, whole grains, low-fat dairy products, and low-fat meat, fish, and poultry.  Limit fats such as oils, salad dressings, butter, nuts, and avocado.  Keep a food diary to identify foods that cause symptoms.  Avoid foods that cause reflux. These may be different for different people.  Eat frequent small meals instead of three large meals each day.  Eat your meals slowly, in a relaxed setting.  Limit fried foods.  Cook foods using methods other than frying.  Avoid drinking alcohol.  Avoid drinking large amounts of liquids with your meals.  Avoid bending over or lying down until 2-3 hours after eating. WHAT FOODS ARE NOT RECOMMENDED? The following are some foods and drinks that may worsen your symptoms: Vegetables Tomatoes. Tomato juice. Tomato and spaghetti sauce. Chili peppers. Onion and garlic. Horseradish. Fruits Oranges, grapefruit, and lemon (fruit and juice). Meats High-fat meats, fish, and poultry. This includes hot dogs, ribs, ham, sausage, salami, and bacon. Dairy Whole milk and chocolate milk. Sour cream. Cream. Butter. Ice cream. Cream cheese.  Beverages Coffee and tea, with or without caffeine. Carbonated beverages or energy drinks. Condiments Hot sauce. Barbecue sauce.  Sweets/Desserts Chocolate and cocoa. Donuts. Peppermint and spearmint. Fats and Oils High-fat foods, including [place] fries and potato chips. Other Vinegar. Strong spices, such as black pepper, white pepper, red pepper, cayenne, curry powder,  cloves, ginger, and chili powder. The items listed above may not be a complete list of foods and beverages to avoid. Contact your dietitian for more information.   This information is not intended to replace advice given to you by your health care provider. Make sure you discuss any questions you have with your  health care provider.   Document Released: 06/10/2005 Document Revised: 07/01/2014 Document Reviewed: 04/14/2013 Elsevier Interactive Patient Education 2016 Elsevier Inc.    Heartburn Heartburn is a type of pain or discomfort that can happen in the throat or chest. It is often described as a burning pain. It may also cause a bad taste in the mouth. Heartburn may feel worse when you lie down or bend over, and it is often worse at night. Heartburn may be caused by stomach contents that move back up into the esophagus (reflux). HOME CARE INSTRUCTIONS Take these actions to decrease your discomfort and to help avoid complications. Diet  Follow a diet as recommended by your health care provider. This may involve avoiding foods and drinks such as:  Coffee and tea (with or without caffeine).  Drinks that contain alcohol.  Energy drinks and sports drinks.  Carbonated drinks or sodas.  Chocolate and cocoa.  Peppermint and mint flavorings.  Garlic and onions.  Horseradish.  Spicy and acidic foods, including peppers, chili powder, curry powder, vinegar, hot sauces, and barbecue sauce.  Citrus fruit juices and citrus fruits, such as oranges, lemons, and limes.  Tomato-based foods, such as red sauce, chili, salsa, and pizza with red sauce.  Fried and fatty foods, such as donuts, french fries, potato chips, and high-fat dressings.  High-fat meats, such as hot dogs and fatty cuts of red and white meats, such as rib eye steak, sausage, ham, and bacon.  High-fat dairy items, such as whole milk, butter, and cream cheese.  Eat small, frequent meals instead of large meals.  Avoid  drinking large amounts of liquid with your meals.  Avoid eating meals during the 2-3 hours before bedtime.  Avoid lying down right after you eat.  Do not exercise right after you eat. General Instructions  Pay attention to any changes in your symptoms.  Take over-the-counter and prescription medicines only as told by your health care provider. Do not take aspirin, ibuprofen, or other NSAIDs unless your health care provider told you to do so.  Do not use any tobacco products, including cigarettes, chewing tobacco, and e-cigarettes. If you need help quitting, ask your health care provider.  Wear loose-fitting clothing. Do not wear anything tight around your waist that causes pressure on your abdomen.  Raise (elevate) the head of your bed about 6 inches (15 cm).  Try to reduce your stress, such as with yoga or meditation. If you need help reducing stress, ask your health care provider.  If you are overweight, reduce your weight to an amount that is healthy for you. Ask your health care provider for guidance about a safe weight loss goal.  Keep all follow-up visits as told by your health care provider. This is important. SEEK MEDICAL CARE IF:  You have new symptoms.  You have unexplained weight loss.  You have difficulty swallowing, or it hurts to swallow.  You have wheezing or a persistent cough.  Your symptoms do not improve with treatment.  You have frequent heartburn for more than two weeks. SEEK IMMEDIATE MEDICAL CARE IF:  You have pain in your arms, neck, jaw, teeth, or back.  You feel sweaty, dizzy, or light-headed.  You have chest pain or shortness of breath.  You vomit and your vomit looks like blood or coffee grounds.  Your stool is bloody or black.   This information is not intended to replace advice given to you by your health care provider. Make sure you discuss any questions you  have with your health care provider.   Document Released: 10/27/2008  Document Revised: 03/01/2015 Document Reviewed: 10/05/2014 Elsevier Interactive Patient Education 2016 Elsevier Inc.   Nonspecific Chest Pain  Chest pain can be caused by many different conditions. There is always a chance that your pain could be related to something serious, such as a heart attack or a blood clot in your lungs. Chest pain can also be caused by conditions that are not life-threatening. If you have chest pain, it is very important to follow up with your health care provider. CAUSES  Chest pain can be caused by:  Heartburn.  Pneumonia or bronchitis.  Anxiety or stress.  Inflammation around your heart (pericarditis) or lung (pleuritis or pleurisy).  A blood clot in your lung.  A collapsed lung (pneumothorax). It can develop suddenly on its own (spontaneous pneumothorax) or from trauma to the chest.  Shingles infection (varicella-zoster virus).  Heart attack.  Damage to the bones, muscles, and cartilage that make up your chest wall. This can include:  Bruised bones due to injury.  Strained muscles or cartilage due to frequent or repeated coughing or overwork.  Fracture to one or more ribs.  Sore cartilage due to inflammation (costochondritis). RISK FACTORS  Risk factors for chest pain may include:  Activities that increase your risk for trauma or injury to your chest.  Respiratory infections or conditions that cause frequent coughing.  Medical conditions or overeating that can cause heartburn.  Heart disease or family history of heart disease.  Conditions or health behaviors that increase your risk of developing a blood clot.  Having had chicken pox (varicella zoster). SIGNS AND SYMPTOMS Chest pain can feel like:  Burning or tingling on the surface of your chest or deep in your chest.  Crushing, pressure, aching, or squeezing pain.  Dull or sharp pain that is worse when you move, cough, or take a deep breath.  Pain that is also felt in your back,  neck, shoulder, or arm, or pain that spreads to any of these areas. Your chest pain may come and go, or it may stay constant. DIAGNOSIS Lab tests or other studies may be needed to find the cause of your pain. Your health care provider may have you take a test called an ambulatory ECG (electrocardiogram). An ECG records your heartbeat patterns at the time the test is performed. You may also have other tests, such as:  Transthoracic echocardiogram (TTE). During echocardiography, sound waves are used to create a picture of all of the heart structures and to look at how blood flows through your heart.  Transesophageal echocardiogram (TEE).This is a more advanced imaging test that obtains images from inside your body. It allows your health care provider to see your heart in finer detail.  Cardiac monitoring. This allows your health care provider to monitor your heart rate and rhythm in real time.  Holter monitor. This is a portable device that records your heartbeat and can help to diagnose abnormal heartbeats. It allows your health care provider to track your heart activity for several days, if needed.  Stress tests. These can be done through exercise or by taking medicine that makes your heart beat more quickly.  Blood tests.  Imaging tests. TREATMENT  Your treatment depends on what is causing your chest pain. Treatment may include:  Medicines. These may include:  Acid blockers for heartburn.  Anti-inflammatory medicine.  Pain medicine for inflammatory conditions.  Antibiotic medicine, if an infection is present.  Medicines to dissolve  blood clots.  Medicines to treat coronary artery disease.  Supportive care for conditions that do not require medicines. This may include:  Resting.  Applying heat or cold packs to injured areas.  Limiting activities until pain decreases. HOME CARE INSTRUCTIONS  If you were prescribed an antibiotic medicine, finish it all even if you start to  feel better.  Avoid any activities that bring on chest pain.  Do not use any tobacco products, including cigarettes, chewing tobacco, or electronic cigarettes. If you need help quitting, ask your health care provider.  Do not drink alcohol.  Take medicines only as directed by your health care provider.  Keep all follow-up visits as directed by your health care provider. This is important. This includes any further testing if your chest pain does not go away.  If heartburn is the cause for your chest pain, you may be told to keep your head raised (elevated) while sleeping. This reduces the chance that acid will go from your stomach into your esophagus.  Make lifestyle changes as directed by your health care provider. These may include:  Getting regular exercise. Ask your health care provider to suggest some activities that are safe for you.  Eating a heart-healthy diet. A registered dietitian can help you to learn healthy eating options.  Maintaining a healthy weight.  Managing diabetes, if necessary.  Reducing stress. SEEK MEDICAL CARE IF:  Your chest pain does not go away after treatment.  You have a rash with blisters on your chest.  You have a fever. SEEK IMMEDIATE MEDICAL CARE IF:   Your chest pain is worse.  You have an increasing cough, or you cough up blood.  You have severe abdominal pain.  You have severe weakness.  You faint.  You have chills.  You have sudden, unexplained chest discomfort.  You have sudden, unexplained discomfort in your arms, back, neck, or jaw.  You have shortness of breath at any time.  You suddenly start to sweat, or your skin gets clammy.  You feel nauseous or you vomit.  You suddenly feel light-headed or dizzy.  Your heart begins to beat quickly, or it feels like it is skipping beats. These symptoms may represent a serious problem that is an emergency. Do not wait to see if the symptoms will go away. Get medical help right  away. Call your local emergency services (911 in the [place] Do not drive yourself to the hospital.   This information is not intended to replace advice given to you by your health care provider. Make sure you discuss any questions you have with your health care provider.   Document Released: 03/20/2005 Document Revised: 07/01/2014 Document Reviewed: 01/14/2014 Elsevier Interactive Patient Education 2016 Elsevier Inc. Hernia, Adult A hernia is the bulging of an organ or tissue through a weak spot in the muscles of the abdomen (abdominal wall). Hernias develop most often near the navel or groin. There are many kinds of hernias. Common kinds include:  Femoral hernia. This kind of hernia develops under the groin in the upper thigh area.  Inguinal hernia. This kind of hernia develops in the groin or scrotum.  Umbilical hernia. This kind of hernia develops near the navel.  Hiatal hernia. This kind of hernia causes part of the stomach to be pushed up into the chest.  Incisional hernia. This kind of hernia bulges through a scar from an abdominal surgery. CAUSES This condition may be caused by:  Heavy lifting.  Coughing over a long period of  time.  Straining to have a bowel movement.  An incision made during an abdominal surgery.  A birth defect (congenital defect).  Excess weight or obesity.  Smoking.  Poor nutrition.  Cystic fibrosis.  Excess fluid in the abdomen.  Undescended testicles. SYMPTOMS Symptoms of a hernia include:  A lump on the abdomen. This is the first sign of a hernia. The lump may become more obvious with standing, straining, or coughing. It may get bigger over time if it is not treated or if the condition causing it is not treated.  Pain. A hernia is usually painless, but it may become painful over time if treatment is delayed. The pain is usually dull and may get worse with standing or lifting heavy objects. Sometimes a hernia gets tightly squeezed in the  weak spot (strangulated) or stuck there (incarcerated) and causes additional symptoms. These symptoms may include:  Vomiting.  Nausea.  Constipation.  Irritability. DIAGNOSIS A hernia may be diagnosed with:  A physical exam. During the exam your health care provider may ask you to cough or to make a specific movement, because a hernia is usually more visible when you move.  Imaging tests. These can include:  X-rays.  Ultrasound.  CT scan. TREATMENT A hernia that is small and painless may not need to be treated. A hernia that is large or painful may be treated with surgery. Inguinal hernias may be treated with surgery to prevent incarceration or strangulation. Strangulated hernias are always treated with surgery, because lack of blood to the trapped organ or tissue can cause it to die. Surgery to treat a hernia involves pushing the bulge back into place and repairing the weak part of the abdomen. HOME CARE INSTRUCTIONS  Avoid straining.  Do not lift anything heavier than 10 lb (4.5 kg).  Lift with your leg muscles, not your back muscles. This helps avoid strain.  When coughing, try to cough gently.  Prevent constipation. Constipation leads to straining with bowel movements, which can make a hernia worse or cause a hernia repair to break down. You can prevent constipation by:  Eating a high-fiber diet that includes plenty of fruits and vegetables.  Drinking enough fluids to keep your urine clear or pale yellow. Aim to drink 6-8 glasses of water per day.  Using a stool softener as directed by your health care provider.  Lose weight, if you are overweight.  Do not use any tobacco products, including cigarettes, chewing tobacco, or electronic cigarettes. If you need help quitting, ask your health care provider.  Keep all follow-up visits as directed by your health care provider. This is important. Your health care provider may need to monitor your condition. SEEK MEDICAL  CARE IF:  You have swelling, redness, and pain in the affected area.  Your bowel habits change. SEEK IMMEDIATE MEDICAL CARE IF:  You have a fever.  You have abdominal pain that is getting worse.  You feel nauseous or you vomit.  You cannot push the hernia back in place by gently pressing on it while you are lying down.  The hernia:  Changes in shape or size.  Is stuck outside the abdomen.  Becomes discolored.  Feels hard or tender.   This information is not intended to replace advice given to you by your health care provider. Make sure you discuss any questions you have with your health care provider.   Document Released: 06/10/2005 Document Revised: 07/01/2014 Document Reviewed: 04/20/2014 Elsevier Interactive Patient Education [employer]2016 Elsevier Inc.  IF you received an x-ray today, you will receive an invoice from [facility]. Please contact [facility] at [phone] with questions or concerns regarding your invoice.   IF you received labwork today, you will receive an invoice from [employer]. Please contact [facility] at [phone] with questions or concerns regarding your invoice.   Our billing staff will not be able to assist you with questions regarding bills from these companies.  You will be contacted with the lab results as soon as they are available. The fastest way to get your results is to activate your My Chart account. Instructions are located on the last page of this paperwork. If you have not heard from [place] regarding the results in 2 weeks, please contact this office.        Signed,   Meredith Staggers, MD [facility].  04/08/16 10:55 PM

## 2016-04-08 NOTE — Patient Instructions (Addendum)
I will refer you to gastroenterology to discuss your suspected heartburn symptoms, but sometimes the symptoms can come from the esophagus not relaxing enough to allow food to pass. This can also be evaluated with gastroenterology. For now avoid spicy foods fried foods and bland soft diet if possible. If you are unable to pass food, especially fluids, return here or the emergency room. If any worsening of your symptoms, including shortness of breath with chest pain, be seen in the emergency room.  See information below on constipation. Make sure you're drinking plenty of fluids, fiber in the diet.  I also referred you to surgeon to discuss the hernia and whether it needs to be repaired. See precautions below  Food Choices for Gastroesophageal Reflux Disease, Adult When you have gastroesophageal reflux disease (GERD), the foods you eat and your eating habits are very important. Choosing the right foods can help ease the discomfort of GERD. WHAT GENERAL GUIDELINES DO I NEED TO FOLLOW?  Choose fruits, vegetables, whole grains, low-fat dairy products, and low-fat meat, fish, and poultry.  Limit fats such as oils, salad dressings, butter, nuts, and avocado.  Keep a food diary to identify foods that cause symptoms.  Avoid foods that cause reflux. These may be different for different people.  Eat frequent small meals instead of three large meals each day.  Eat your meals slowly, in a relaxed setting.  Limit fried foods.  Cook foods using methods other than frying.  Avoid drinking alcohol.  Avoid drinking large amounts of liquids with your meals.  Avoid bending over or lying down until 2-3 hours after eating. WHAT FOODS ARE NOT RECOMMENDED? The following are some foods and drinks that may worsen your symptoms: Vegetables Tomatoes. Tomato juice. Tomato and spaghetti sauce. Chili peppers. Onion and garlic. Horseradish. Fruits Oranges, grapefruit, and lemon (fruit and juice). Meats High-fat  meats, fish, and poultry. This includes hot dogs, ribs, ham, sausage, salami, and bacon. Dairy Whole milk and chocolate milk. Sour cream. Cream. Butter. Ice cream. Cream cheese.  Beverages Coffee and tea, with or without caffeine. Carbonated beverages or energy drinks. Condiments Hot sauce. Barbecue sauce.  Sweets/Desserts Chocolate and cocoa. Donuts. Peppermint and spearmint. Fats and Oils High-fat foods, including [place] fries and potato chips. Other Vinegar. Strong spices, such as black pepper, white pepper, red pepper, cayenne, curry powder, cloves, ginger, and chili powder. The items listed above may not be a complete list of foods and beverages to avoid. Contact your dietitian for more information.   This information is not intended to replace advice given to you by your health care provider. Make sure you discuss any questions you have with your health care provider.   Document Released: 06/10/2005 Document Revised: 07/01/2014 Document Reviewed: 04/14/2013 Elsevier Interactive Patient Education 2016 Elsevier Inc.    Heartburn Heartburn is a type of pain or discomfort that can happen in the throat or chest. It is often described as a burning pain. It may also cause a bad taste in the mouth. Heartburn may feel worse when you lie down or bend over, and it is often worse at night. Heartburn may be caused by stomach contents that move back up into the esophagus (reflux). HOME CARE INSTRUCTIONS Take these actions to decrease your discomfort and to help avoid complications. Diet  Follow a diet as recommended by your health care provider. This may involve avoiding foods and drinks such as:  Coffee and tea (with or without caffeine).  Drinks that contain alcohol.  Energy drinks and  sports drinks.  Carbonated drinks or sodas.  Chocolate and cocoa.  Peppermint and mint flavorings.  Garlic and onions.  Horseradish.  Spicy and acidic foods, including peppers, chili powder,  curry powder, vinegar, hot sauces, and barbecue sauce.  Citrus fruit juices and citrus fruits, such as oranges, lemons, and limes.  Tomato-based foods, such as red sauce, chili, salsa, and pizza with red sauce.  Fried and fatty foods, such as donuts, french fries, potato chips, and high-fat dressings.  High-fat meats, such as hot dogs and fatty cuts of red and white meats, such as rib eye steak, sausage, ham, and bacon.  High-fat dairy items, such as whole milk, butter, and cream cheese.  Eat small, frequent meals instead of large meals.  Avoid drinking large amounts of liquid with your meals.  Avoid eating meals during the 2-3 hours before bedtime.  Avoid lying down right after you eat.  Do not exercise right after you eat. General Instructions  Pay attention to any changes in your symptoms.  Take over-the-counter and prescription medicines only as told by your health care provider. Do not take aspirin, ibuprofen, or other NSAIDs unless your health care provider told you to do so.  Do not use any tobacco products, including cigarettes, chewing tobacco, and e-cigarettes. If you need help quitting, ask your health care provider.  Wear loose-fitting clothing. Do not wear anything tight around your waist that causes pressure on your abdomen.  Raise (elevate) the head of your bed about 6 inches (15 cm).  Try to reduce your stress, such as with yoga or meditation. If you need help reducing stress, ask your health care provider.  If you are overweight, reduce your weight to an amount that is healthy for you. Ask your health care provider for guidance about a safe weight loss goal.  Keep all follow-up visits as told by your health care provider. This is important. SEEK MEDICAL CARE IF:  You have new symptoms.  You have unexplained weight loss.  You have difficulty swallowing, or it hurts to swallow.  You have wheezing or a persistent cough.  Your symptoms do not improve  with treatment.  You have frequent heartburn for more than two weeks. SEEK IMMEDIATE MEDICAL CARE IF:  You have pain in your arms, neck, jaw, teeth, or back.  You feel sweaty, dizzy, or light-headed.  You have chest pain or shortness of breath.  You vomit and your vomit looks like blood or coffee grounds.  Your stool is bloody or black.   This information is not intended to replace advice given to you by your health care provider. Make sure you discuss any questions you have with your health care provider.   Document Released: 10/27/2008 Document Revised: 03/01/2015 Document Reviewed: 10/05/2014 Elsevier Interactive Patient Education 2016 Elsevier Inc.   Nonspecific Chest Pain  Chest pain can be caused by many different conditions. There is always a chance that your pain could be related to something serious, such as a heart attack or a blood clot in your lungs. Chest pain can also be caused by conditions that are not life-threatening. If you have chest pain, it is very important to follow up with your health care provider. CAUSES  Chest pain can be caused by:  Heartburn.  Pneumonia or bronchitis.  Anxiety or stress.  Inflammation around your heart (pericarditis) or lung (pleuritis or pleurisy).  A blood clot in your lung.  A collapsed lung (pneumothorax). It can develop suddenly on its own (spontaneous pneumothorax)  or from trauma to the chest.  Shingles infection (varicella-zoster virus).  Heart attack.  Damage to the bones, muscles, and cartilage that make up your chest wall. This can include:  Bruised bones due to injury.  Strained muscles or cartilage due to frequent or repeated coughing or overwork.  Fracture to one or more ribs.  Sore cartilage due to inflammation (costochondritis). RISK FACTORS  Risk factors for chest pain may include:  Activities that increase your risk for trauma or injury to your chest.  Respiratory infections or conditions that  cause frequent coughing.  Medical conditions or overeating that can cause heartburn.  Heart disease or family history of heart disease.  Conditions or health behaviors that increase your risk of developing a blood clot.  Having had chicken pox (varicella zoster). SIGNS AND SYMPTOMS Chest pain can feel like:  Burning or tingling on the surface of your chest or deep in your chest.  Crushing, pressure, aching, or squeezing pain.  Dull or sharp pain that is worse when you move, cough, or take a deep breath.  Pain that is also felt in your back, neck, shoulder, or arm, or pain that spreads to any of these areas. Your chest pain may come and go, or it may stay constant. DIAGNOSIS Lab tests or other studies may be needed to find the cause of your pain. Your health care provider may have you take a test called an ambulatory ECG (electrocardiogram). An ECG records your heartbeat patterns at the time the test is performed. You may also have other tests, such as:  Transthoracic echocardiogram (TTE). During echocardiography, sound waves are used to create a picture of all of the heart structures and to look at how blood flows through your heart.  Transesophageal echocardiogram (TEE).This is a more advanced imaging test that obtains images from inside your body. It allows your health care provider to see your heart in finer detail.  Cardiac monitoring. This allows your health care provider to monitor your heart rate and rhythm in real time.  Holter monitor. This is a portable device that records your heartbeat and can help to diagnose abnormal heartbeats. It allows your health care provider to track your heart activity for several days, if needed.  Stress tests. These can be done through exercise or by taking medicine that makes your heart beat more quickly.  Blood tests.  Imaging tests. TREATMENT  Your treatment depends on what is causing your chest pain. Treatment may  include:  Medicines. These may include:  Acid blockers for heartburn.  Anti-inflammatory medicine.  Pain medicine for inflammatory conditions.  Antibiotic medicine, if an infection is present.  Medicines to dissolve blood clots.  Medicines to treat coronary artery disease.  Supportive care for conditions that do not require medicines. This may include:  Resting.  Applying heat or cold packs to injured areas.  Limiting activities until pain decreases. HOME CARE INSTRUCTIONS  If you were prescribed an antibiotic medicine, finish it all even if you start to feel better.  Avoid any activities that bring on chest pain.  Do not use any tobacco products, including cigarettes, chewing tobacco, or electronic cigarettes. If you need help quitting, ask your health care provider.  Do not drink alcohol.  Take medicines only as directed by your health care provider.  Keep all follow-up visits as directed by your health care provider. This is important. This includes any further testing if your chest pain does not go away.  If heartburn is the cause for  your chest pain, you may be told to keep your head raised (elevated) while sleeping. This reduces the chance that acid will go from your stomach into your esophagus.  Make lifestyle changes as directed by your health care provider. These may include:  Getting regular exercise. Ask your health care provider to suggest some activities that are safe for you.  Eating a heart-healthy diet. A registered dietitian can help you to learn healthy eating options.  Maintaining a healthy weight.  Managing diabetes, if necessary.  Reducing stress. SEEK MEDICAL CARE IF:  Your chest pain does not go away after treatment.  You have a rash with blisters on your chest.  You have a fever. SEEK IMMEDIATE MEDICAL CARE IF:   Your chest pain is worse.  You have an increasing cough, or you cough up blood.  You have severe abdominal pain.  You  have severe weakness.  You faint.  You have chills.  You have sudden, unexplained chest discomfort.  You have sudden, unexplained discomfort in your arms, back, neck, or jaw.  You have shortness of breath at any time.  You suddenly start to sweat, or your skin gets clammy.  You feel nauseous or you vomit.  You suddenly feel light-headed or dizzy.  Your heart begins to beat quickly, or it feels like it is skipping beats. These symptoms may represent a serious problem that is an emergency. Do not wait to see if the symptoms will go away. Get medical help right away. Call your local emergency services (911 in the [place] Do not drive yourself to the hospital.   This information is not intended to replace advice given to you by your health care provider. Make sure you discuss any questions you have with your health care provider.   Document Released: 03/20/2005 Document Revised: 07/01/2014 Document Reviewed: 01/14/2014 Elsevier Interactive Patient Education 2016 Elsevier Inc. Hernia, Adult A hernia is the bulging of an organ or tissue through a weak spot in the muscles of the abdomen (abdominal wall). Hernias develop most often near the navel or groin. There are many kinds of hernias. Common kinds include:  Femoral hernia. This kind of hernia develops under the groin in the upper thigh area.  Inguinal hernia. This kind of hernia develops in the groin or scrotum.  Umbilical hernia. This kind of hernia develops near the navel.  Hiatal hernia. This kind of hernia causes part of the stomach to be pushed up into the chest.  Incisional hernia. This kind of hernia bulges through a scar from an abdominal surgery. CAUSES This condition may be caused by:  Heavy lifting.  Coughing over a long period of time.  Straining to have a bowel movement.  An incision made during an abdominal surgery.  A birth defect (congenital defect).  Excess weight or obesity.  Smoking.  Poor  nutrition.  Cystic fibrosis.  Excess fluid in the abdomen.  Undescended testicles. SYMPTOMS Symptoms of a hernia include:  A lump on the abdomen. This is the first sign of a hernia. The lump may become more obvious with standing, straining, or coughing. It may get bigger over time if it is not treated or if the condition causing it is not treated.  Pain. A hernia is usually painless, but it may become painful over time if treatment is delayed. The pain is usually dull and may get worse with standing or lifting heavy objects. Sometimes a hernia gets tightly squeezed in the weak spot (strangulated) or stuck there (incarcerated) and  causes additional symptoms. These symptoms may include:  Vomiting.  Nausea.  Constipation.  Irritability. DIAGNOSIS A hernia may be diagnosed with:  A physical exam. During the exam your health care provider may ask you to cough or to make a specific movement, because a hernia is usually more visible when you move.  Imaging tests. These can include:  X-rays.  Ultrasound.  CT scan. TREATMENT A hernia that is small and painless may not need to be treated. A hernia that is large or painful may be treated with surgery. Inguinal hernias may be treated with surgery to prevent incarceration or strangulation. Strangulated hernias are always treated with surgery, because lack of blood to the trapped organ or tissue can cause it to die. Surgery to treat a hernia involves pushing the bulge back into place and repairing the weak part of the abdomen. HOME CARE INSTRUCTIONS  Avoid straining.  Do not lift anything heavier than 10 lb (4.5 kg).  Lift with your leg muscles, not your back muscles. This helps avoid strain.  When coughing, try to cough gently.  Prevent constipation. Constipation leads to straining with bowel movements, which can make a hernia worse or cause a hernia repair to break down. You can prevent constipation by:  Eating a high-fiber diet  that includes plenty of fruits and vegetables.  Drinking enough fluids to keep your urine clear or pale yellow. Aim to drink 6-8 glasses of water per day.  Using a stool softener as directed by your health care provider.  Lose weight, if you are overweight.  Do not use any tobacco products, including cigarettes, chewing tobacco, or electronic cigarettes. If you need help quitting, ask your health care provider.  Keep all follow-up visits as directed by your health care provider. This is important. Your health care provider may need to monitor your condition. SEEK MEDICAL CARE IF:  You have swelling, redness, and pain in the affected area.  Your bowel habits change. SEEK IMMEDIATE MEDICAL CARE IF:  You have a fever.  You have abdominal pain that is getting worse.  You feel nauseous or you vomit.  You cannot push the hernia back in place by gently pressing on it while you are lying down.  The hernia:  Changes in shape or size.  Is stuck outside the abdomen.  Becomes discolored.  Feels hard or tender.   This information is not intended to replace advice given to you by your health care provider. Make sure you discuss any questions you have with your health care provider.   Document Released: 06/10/2005 Document Revised: 07/01/2014 Document Reviewed: 04/20/2014 Elsevier Interactive Patient Education 2016 [employer].    IF you received an x-ray today, you will receive an invoice from [facility]. Please contact [facility] at [phone] with questions or concerns regarding your invoice.   IF you received labwork today, you will receive an invoice from [employer]. Please contact [facility] at [phone] with questions or concerns regarding your invoice.   Our billing staff will not be able to assist you with questions regarding bills from these companies.  You will be contacted with the lab results as soon as they are  available. The fastest way to get your results is to activate your My Chart account. Instructions are located on the last page of this paperwork. If you have not heard from [place] regarding the results in 2 weeks, please contact this office.

## 2016-04-09 ENCOUNTER — Encounter: Payer: Self-pay | Admitting: Gastroenterology

## 2016-04-15 ENCOUNTER — Encounter: Payer: Self-pay | Admitting: *Deleted

## 2016-05-02 ENCOUNTER — Other Ambulatory Visit: Payer: Self-pay | Admitting: General Surgery

## 2016-06-10 ENCOUNTER — Ambulatory Visit: Payer: 59 | Admitting: Gastroenterology

## 2016-06-14 ENCOUNTER — Encounter: Payer: Self-pay | Admitting: Family Medicine

## 2016-08-11 ENCOUNTER — Encounter (HOSPITAL_COMMUNITY): Payer: Self-pay

## 2016-08-11 ENCOUNTER — Emergency Department (HOSPITAL_COMMUNITY)
Admission: EM | Admit: 2016-08-11 | Discharge: 2016-08-11 | Disposition: A | Discharge status: Home / Self Care | Payer: 59 | Attending: Emergency Medicine | Admitting: Emergency Medicine

## 2016-08-11 DIAGNOSIS — R05 Cough: Secondary | ICD-10-CM | POA: Diagnosis present

## 2016-08-11 DIAGNOSIS — Z91040 Latex allergy status: Secondary | ICD-10-CM | POA: Insufficient documentation

## 2016-08-11 DIAGNOSIS — J069 Acute upper respiratory infection, unspecified: Secondary | ICD-10-CM

## 2016-08-11 DIAGNOSIS — Z79899 Other long term (current) drug therapy: Secondary | ICD-10-CM | POA: Diagnosis not present

## 2016-08-11 DIAGNOSIS — B9789 Other viral agents as the cause of diseases classified elsewhere: Secondary | ICD-10-CM

## 2016-08-11 DIAGNOSIS — I10 Essential (primary) hypertension: Secondary | ICD-10-CM | POA: Insufficient documentation

## 2016-08-11 NOTE — ED Notes (Signed)
Declined W/C at D/C and was escorted to lobby by RN. 

## 2016-08-11 NOTE — ED Provider Notes (Signed)
[facility]-EMERGENCY DEPT Provider Note   CSN: [id number] Arrival date & time: 08/11/16  1347  By signing my name below, I, Freida Busmaniana Omoyeni, attest that this documentation has been prepared under the direction and in the presence of Margarita Grizzleanielle Andriy Sherk, MD . Electronically Signed: Freida Busmaniana Omoyeni, Scribe. 08/11/2016. 3:44 PM.  History   Chief Complaint Chief Complaint  Patient presents with  . Cough    The history is provided by the patient. No language interpreter was used.     HPI Comments:  [name] is a 31 y.o. female who presents to the Emergency Department complaining of a cough x ~ 1 week worse and productive x ~3 days. She states she has been bringing up yellow sputum. Pt states at time of initial onset she was also experiencing fever, chills and body aches. She notes the fever has resolved but notes associated body aches and congestion at this time. She has been taking Dayquil/Nyquil with minimal relief. She is still drinking fluids as she normally would.   Past Medical History:  Diagnosis Date  . Abdominal hernia   . Anxiety   . Pregnancy induced hypertension     Patient Active Problem List   Diagnosis Date Noted  . Normal pregnancy 01/07/2013  . SVD (spontaneous vaginal delivery) 08/20/2011  . Gestational hypertension 08/19/2011  . CONSTIPATION 03/06/2009  . PAIN IN JOINT, LOWER LEG 03/06/2009  . FATIGUE 03/06/2009  . WEIGHT GAIN, ABNORMAL 03/06/2009  . ABDOMINAL PAIN OTHER SPECIFIED SITE 03/28/2008  . HEADACHE 03/23/2008  . MIGRAINE HEADACHE 06/29/2007  . HYPERTENSION, BENIGN ESSENTIAL 06/29/2007  . UTI'S, RECURRENT 06/29/2007  . ACNE, MILD 06/26/2007    Past Surgical History:  Procedure Laterality Date  . NO PAST SURGERIES      OB History    Gravida Para Term Preterm AB Living   3 3 3  0 0 3   SAB TAB Ectopic Multiple Live Births   0 0 0 0 3       Home Medications    Prior to Admission medications   Medication Sig Start Date End Date Taking? Authorizing  Provider  Naproxen Sodium (ALEVE PO) Take 2 tablets by mouth daily as needed (for pain).    Historical Provider, MD  omeprazole (PRILOSEC) 20 MG capsule Take 1 capsule (20 mg total) by mouth daily. 04/08/16   Shade FloodJeffrey R Greene, MD  Prenatal Vit-Fe Fumarate-FA (PRENATAL MULTIVITAMIN) TABS Take 1 tablet by mouth daily at 12 noon.    Historical Provider, MD  sucralfate (CARAFATE) 1 GM/10ML suspension Take 10 mLs (1 g total) by mouth 4 (four) times daily -  with meals and at bedtime. 04/08/16   Shade FloodJeffrey R Greene, MD  traMADol (ULTRAM) 50 MG tablet Take 1 tablet (50 mg total) by mouth every 6 (six) hours as needed. Patient not taking: Reported on 04/08/2016 05/17/13   Earley FavorGail Schulz, NP    Family History Family History  Problem Relation Age of Onset  . Asthma Mother   . Diabetes Maternal Grandmother   . Hypertension Maternal Grandmother   . Asthma Maternal Grandfather   . Anesthesia problems Neg Hx     Social History Social History  Substance Use Topics  . Smoking status: Never Smoker  . Smokeless tobacco: Never Used  . Alcohol use No     Allergies   Latex   Review of Systems Review of Systems  Constitutional: Negative for fever.  HENT: Positive for congestion.   Respiratory: Positive for cough.   Musculoskeletal: Positive for myalgias.  Physical Exam Updated Vital Signs BP 153/71   Pulse 90   Temp 99.7 F (37.6 C) (Oral)   Resp 18   SpO2 99%   Physical Exam  Constitutional: She is oriented to person, place, and time. She appears well-developed and well-nourished. No distress.  HENT:  Head: Normocephalic and atraumatic.  Mouth/Throat: Oropharynx is clear and moist.  Eyes: Conjunctivae are normal.  Cardiovascular: Normal rate and regular rhythm.   Pulmonary/Chest: Effort normal and breath sounds normal. No respiratory distress. She has no wheezes.  Abdominal: She exhibits no distension.  Neurological: She is alert and oriented to person, place, and time.  Skin:  Skin is warm and dry.  Psychiatric: She has a normal mood and affect.  Nursing note and vitals reviewed.    ED Treatments / Results  DIAGNOSTIC STUDIES:  Oxygen Saturation is 99% on RA, normal by my interpretation.    COORDINATION OF CARE:  3:43 PM Discussed treatment plan with pt at bedside and pt agreed to plan.  Labs (all labs ordered are listed, but only abnormal results are displayed) Labs Reviewed - No data to display  EKG  EKG Interpretation None       Radiology No results found.  Procedures Procedures (including critical care time)  Medications Ordered in ED Medications - No data to display   Initial Impression / Assessment and Plan / ED Course  I have reviewed the triage vital signs and the nursing notes.  Pertinent labs & imaging results that were available during my care of the patient were reviewed by me and considered in my medical decision making (see chart for details).       Final Clinical Impressions(s) / ED Diagnoses   Final diagnoses:  Viral URI with cough    New Prescriptions New Prescriptions   No medications on file   I personally performed the services described in this documentation, which was scribed in my presence. The recorded information has been reviewed and considered.    Margarita Grizzle, MD 08/11/16 [phone]

## 2016-08-11 NOTE — ED Triage Notes (Signed)
Patient complains of cough, congetion and body aches x 1 week, NAD

## 2019-06-05 ENCOUNTER — Emergency Department (HOSPITAL_COMMUNITY)
Admission: EM | Admit: 2019-06-05 | Discharge: 2019-06-05 | Disposition: A | Discharge status: Home / Self Care | Payer: 59 | Attending: Emergency Medicine | Admitting: Emergency Medicine

## 2019-06-05 ENCOUNTER — Encounter (HOSPITAL_COMMUNITY): Payer: Self-pay | Admitting: Emergency Medicine

## 2019-06-05 ENCOUNTER — Emergency Department (HOSPITAL_COMMUNITY): Payer: 59

## 2019-06-05 ENCOUNTER — Other Ambulatory Visit: Payer: Self-pay

## 2019-06-05 DIAGNOSIS — Z79899 Other long term (current) drug therapy: Secondary | ICD-10-CM | POA: Diagnosis not present

## 2019-06-05 DIAGNOSIS — M542 Cervicalgia: Secondary | ICD-10-CM | POA: Diagnosis present

## 2019-06-05 DIAGNOSIS — Y999 Unspecified external cause status: Secondary | ICD-10-CM | POA: Diagnosis not present

## 2019-06-05 DIAGNOSIS — Z91040 Latex allergy status: Secondary | ICD-10-CM | POA: Insufficient documentation

## 2019-06-05 DIAGNOSIS — I10 Essential (primary) hypertension: Secondary | ICD-10-CM | POA: Diagnosis not present

## 2019-06-05 DIAGNOSIS — M79601 Pain in right arm: Secondary | ICD-10-CM | POA: Insufficient documentation

## 2019-06-05 DIAGNOSIS — Y92410 Unspecified street and highway as the place of occurrence of the external cause: Secondary | ICD-10-CM | POA: Diagnosis not present

## 2019-06-05 DIAGNOSIS — M25561 Pain in right knee: Secondary | ICD-10-CM | POA: Diagnosis not present

## 2019-06-05 DIAGNOSIS — Y93I9 Activity, other involving external motion: Secondary | ICD-10-CM | POA: Diagnosis not present

## 2019-06-05 MED ORDER — DIAZEPAM 5 MG PO TABS
5.0000 mg | ORAL_TABLET | Freq: Once | ORAL | Status: AC
  Administered 2019-06-05: 5 mg via ORAL
  Filled 2019-06-05: qty 1

## 2019-06-05 MED ORDER — CYCLOBENZAPRINE HCL 5 MG PO TABS: 5.0000 mg | ORAL_TABLET | Freq: Three times a day (TID) | ORAL | 0 refills | Status: AC | PRN

## 2019-06-05 MED ORDER — KETOROLAC TROMETHAMINE 30 MG/ML IJ SOLN
60.0000 mg | Freq: Once | INTRAMUSCULAR | Status: AC
  Administered 2019-06-05: 11:00:00 60 mg via INTRAMUSCULAR
  Filled 2019-06-05: qty 2

## 2019-06-05 MED ORDER — IBUPROFEN 800 MG PO TABS: 800.0000 mg | ORAL_TABLET | Freq: Three times a day (TID) | ORAL | 0 refills | Status: AC | PRN

## 2019-06-05 NOTE — ED Notes (Signed)
Patient verbalizes understanding of discharge instructions. Opportunity for questioning and answers were provided. Armband removed by staff, pt discharged from ED.  
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
## 2019-06-05 NOTE — Discharge Instructions (Signed)
All of your x-rays were negative for any fracture or dislocation. You will likely have musculoskeletal pain for the next 4-6 weeks. You can treat the pain with over the counter extra-strength tylenol and Ibuprofen every 3-6 hours. I recommend to take this regularly until the pain improves. You can also treat with ice, heating pad, and over the counter topical analgesics such as icy-hot, salonpas patches, and Lidocaine patches. See your PCP if things get worse or you have no improvement after 4-6 weeks. Activity is as tolerated, so I would avoid anything that causes you pain. I would avoid heavy lifting until your pain improves.

## 2021-06-09 ENCOUNTER — Emergency Department (HOSPITAL_COMMUNITY)
Admission: EM | Admit: 2021-06-09 | Discharge: 2021-06-09 | Disposition: A | Discharge status: Home / Self Care | Payer: 59 | Attending: Emergency Medicine | Admitting: Emergency Medicine

## 2021-06-09 ENCOUNTER — Emergency Department (HOSPITAL_COMMUNITY): Payer: 59

## 2021-06-09 ENCOUNTER — Encounter (HOSPITAL_COMMUNITY): Payer: Self-pay | Admitting: *Deleted

## 2021-06-09 ENCOUNTER — Other Ambulatory Visit: Payer: Self-pay

## 2021-06-09 ENCOUNTER — Encounter (HOSPITAL_COMMUNITY): Payer: Self-pay | Admitting: Emergency Medicine

## 2021-06-09 ENCOUNTER — Ambulatory Visit (HOSPITAL_COMMUNITY)
Admission: EM | Admit: 2021-06-09 | Discharge: 2021-06-09 | Disposition: A | Discharge status: Other Acute Inpatient Hospital | Payer: 59

## 2021-06-09 DIAGNOSIS — Z91040 Latex allergy status: Secondary | ICD-10-CM | POA: Insufficient documentation

## 2021-06-09 DIAGNOSIS — I161 Hypertensive emergency: Secondary | ICD-10-CM | POA: Diagnosis not present

## 2021-06-09 DIAGNOSIS — R519 Headache, unspecified: Secondary | ICD-10-CM | POA: Diagnosis present

## 2021-06-09 DIAGNOSIS — N9489 Other specified conditions associated with female genital organs and menstrual cycle: Secondary | ICD-10-CM | POA: Insufficient documentation

## 2021-06-09 DIAGNOSIS — I10 Essential (primary) hypertension: Secondary | ICD-10-CM | POA: Insufficient documentation

## 2021-06-09 LAB — BASIC METABOLIC PANEL
Anion gap: 7 (ref 5–15)
BUN: 7 mg/dL (ref 6–20)
CO2: 23 mmol/L (ref 22–32)
Calcium: 8.9 mg/dL (ref 8.9–10.3)
Chloride: 110 mmol/L (ref 98–111)
Creatinine, Ser: 0.69 mg/dL (ref 0.44–1.00)
GFR, Estimated: >60 mL/min (ref >60)
Glucose, Bld: 96 mg/dL (ref 70–99)
Potassium: 4.1 mmol/L (ref 3.5–5.1)
Sodium: 140 mmol/L (ref 135–145)

## 2021-06-09 LAB — CBC
HCT: 39.7 % (ref 36.0–46.0)
Hemoglobin: 13 g/dL (ref 12.0–15.0)
MCH: 25.8 pg — ABNORMAL LOW (ref 26.0–34.0)
MCHC: 32.7 g/dL (ref 30.0–36.0)
MCV: 78.9 fL — ABNORMAL LOW (ref 80.0–100.0)
Platelets: 330 10*3/uL (ref 150–400)
RBC: 5.03 MIL/uL (ref 3.87–5.11)
RDW: 16 % — ABNORMAL HIGH (ref 11.5–15.5)
WBC: 7.4 10*3/uL (ref 4.0–10.5)
nRBC: 0 % (ref 0.0–0.2)

## 2021-06-09 LAB — I-STAT BETA HCG BLOOD, ED (MC, WL, AP ONLY): I-stat hCG, quantitative: <5 m[IU]/mL (ref <5)

## 2021-06-09 MED ORDER — LOSARTAN POTASSIUM 50 MG PO TABS: 50.0000 mg | ORAL_TABLET | Freq: Every day | ORAL | 3 refills | Status: DC

## 2021-06-09 MED ORDER — LOSARTAN POTASSIUM 50 MG PO TABS: 50.0000 mg | ORAL_TABLET | Freq: Every day | ORAL | 2 refills | Status: DC

## 2021-06-09 MED ORDER — METOCLOPRAMIDE HCL 5 MG/ML IJ SOLN
10.0000 mg | Freq: Once | INTRAMUSCULAR | Status: AC
  Administered 2021-06-09: 10 mg via INTRAVENOUS
  Filled 2021-06-09: qty 2

## 2021-06-09 MED ORDER — SODIUM CHLORIDE 0.9 % IV BOLUS
1000.0000 mL | Freq: Once | INTRAVENOUS | Status: AC
  Administered 2021-06-09: 1000 mL via INTRAVENOUS

## 2021-06-09 MED ORDER — SODIUM CHLORIDE 0.9 % IV SOLN: INTRAVENOUS | Status: DC

## 2021-06-09 MED ORDER — DIPHENHYDRAMINE HCL 50 MG/ML IJ SOLN
25.0000 mg | Freq: Once | INTRAMUSCULAR | Status: AC
  Administered 2021-06-09: 25 mg via INTRAVENOUS
  Filled 2021-06-09: qty 1

## 2021-06-09 MED ORDER — HYDROMORPHONE HCL 1 MG/ML IJ SOLN
1.0000 mg | Freq: Once | INTRAMUSCULAR | Status: AC
  Administered 2021-06-09: 1 mg via INTRAVENOUS
  Filled 2021-06-09: qty 1

## 2021-06-09 MED ORDER — DEXAMETHASONE SODIUM PHOSPHATE 10 MG/ML IJ SOLN
10.0000 mg | Freq: Once | INTRAMUSCULAR | Status: AC
  Administered 2021-06-09: 10 mg via INTRAVENOUS
  Filled 2021-06-09: qty 1

## 2021-06-09 NOTE — ED Triage Notes (Addendum)
Pt went to [employer] for screening for a job. Pt was tod BP was high and is here for a check up. BP was 210/145 and 204/127. Pt  does have a PCP. Pt reports a HA and blurred vision.

## 2021-06-09 NOTE — ED Provider Notes (Addendum)
[facility] EMERGENCY DEPARTMENT Provider Note   CSN: [id number] Arrival date & time: 06/09/21  1112     History Chief Complaint  Patient presents with   Headache    [name] is a 35 y.o. female.  Patient presents with headache all over.  Been nonstop for 3 days.  But for the past 2 years had a headache on and off.  Would normally respond to medication.  No history of migraines in the past no family history of migraines.  Today's headache associated with nausea and blurred vision.  Patient been off all blood pressure medicine for about a year.  She was on losartan.  In the past.  Patient reports taking Excedrin and Tylenol for and ibuprofen in the past for the headaches but for the past 3 days has not helped.  She was told by health screening exam for her job her blood pressure was high she went to urgent care for checkup today and sent to the ED due to the very high blood pressures.  Blood pressure is 192/123 and then repeat was 174/119.  Patient had trouble with high blood pressure during a previous pregnancy.  Patient is not currently breast-feeding.  Patient's labs were done by triage screening.  White count is 7.4.  Hemoglobin 13.  Patient denies any chest pain or shortness of breath.     Renal function is normal.  GFR greater than 60    Past Medical History:  Diagnosis Date   Abdominal hernia    Anxiety    Pregnancy induced hypertension     Patient Active Problem List   Diagnosis Date Noted   Normal pregnancy 01/07/2013   SVD (spontaneous vaginal delivery) 08/20/2011   Gestational hypertension 08/19/2011   CONSTIPATION 03/06/2009   PAIN IN JOINT, LOWER LEG 03/06/2009   FATIGUE 03/06/2009   WEIGHT GAIN, ABNORMAL 03/06/2009   ABDOMINAL PAIN OTHER SPECIFIED SITE 03/28/2008   HEADACHE 03/23/2008   MIGRAINE HEADACHE 06/29/2007   HYPERTENSION, BENIGN ESSENTIAL 06/29/2007   UTI'S, RECURRENT 06/29/2007   ACNE, MILD 06/26/2007    Past Surgical  History:  Procedure Laterality Date   NO PAST SURGERIES       OB History     Gravida  3   Para  3   Term  3   Preterm  0   AB  0   Living  3      SAB  0   IAB  0   Ectopic  0   Multiple  0   Live Births  3           Family History  Problem Relation Age of Onset   Asthma Mother    Diabetes Maternal Grandmother    Hypertension Maternal Grandmother    Asthma Maternal Grandfather    Anesthesia problems Neg Hx     Social History   Tobacco Use   Smoking status: Never   Smokeless tobacco: Never  Substance Use Topics   Alcohol use: No   Drug use: No    Home Medications Prior to Admission medications   Medication Sig Start Date End Date Taking? Authorizing Provider  losartan (COZAAR) 50 MG tablet Take 1 tablet (50 mg total) by mouth daily. 06/09/21  Yes Fredia Sorrow, MD  ibuprofen (ADVIL) 800 MG tablet Take 1 tablet (800 mg total) by mouth every 8 (eight) hours as needed for moderate pain. 06/05/19   Mullis, Kiersten P, DO  Naproxen Sodium (ALEVE PO) Take 2 tablets  by mouth daily as needed (for pain).    [provider]  omeprazole (PRILOSEC) 20 MG capsule Take 1 capsule (20 mg total) by mouth daily. 04/08/16   Shade Flood, MD  Prenatal Vit-Fe Fumarate-FA (PRENATAL MULTIVITAMIN) TABS Take 1 tablet by mouth daily at 12 noon.    [provider]  sucralfate (CARAFATE) 1 GM/10ML suspension Take 10 mLs (1 g total) by mouth 4 (four) times daily -  with meals and at bedtime. 04/08/16   Shade Flood, MD    Allergies    Latex  Review of Systems   Review of Systems  Constitutional:  Negative for chills and fever.  HENT:  Negative for ear pain and sore throat.   Eyes:  Positive for visual disturbance. Negative for photophobia and pain.  Respiratory:  Negative for cough and shortness of breath.   Cardiovascular:  Negative for chest pain and palpitations.  Gastrointestinal:  Positive for nausea. Negative for abdominal pain and  vomiting.  Genitourinary:  Negative for dysuria and hematuria.  Musculoskeletal:  Negative for arthralgias and back pain.  Skin:  Negative for color change and rash.  Neurological:  Positive for headaches. Negative for seizures and syncope.  All other systems reviewed and are negative.  Physical Exam Updated Vital Signs BP (!) 191/113    Pulse 78    Temp 98.9 F (37.2 C) (Oral)    Resp 15    LMP 05/18/2021    SpO2 100%   Physical Exam Vitals and nursing note reviewed.  Constitutional:      General: She is not in acute distress.    Appearance: Normal appearance. She is well-developed.  HENT:     Head: Normocephalic and atraumatic.  Eyes:     Extraocular Movements: Extraocular movements intact.     Conjunctiva/sclera: Conjunctivae normal.     Pupils: Pupils are equal, round, and reactive to light.  Cardiovascular:     Rate and Rhythm: Normal rate and regular rhythm.     Heart sounds: No murmur heard. Pulmonary:     Effort: Pulmonary effort is normal. No respiratory distress.     Breath sounds: Normal breath sounds.  Abdominal:     Palpations: Abdomen is soft.     Tenderness: There is no abdominal tenderness.  Musculoskeletal:        General: No swelling.     Cervical back: Normal range of motion and neck supple.  Skin:    General: Skin is warm and dry.     Capillary Refill: Capillary refill takes less than 2 seconds.  Neurological:     General: No focal deficit present.     Mental Status: She is alert and oriented to person, place, and time.     Cranial Nerves: No cranial nerve deficit.     Sensory: No sensory deficit.     Motor: No weakness.  Psychiatric:        Mood and Affect: Mood normal.    ED Results / Procedures / Treatments   Labs (all labs ordered are listed, but only abnormal results are displayed) Labs Reviewed  CBC - Abnormal; Notable for the following components:      Result Value   MCV 78.9 (*)    MCH 25.8 (*)    RDW 16.0 (*)    All other  components within normal limits  BASIC METABOLIC PANEL  I-STAT BETA HCG BLOOD, ED ([facility], [facility], [facility] ONLY)    EKG None  Radiology No results found.  Procedures  Procedures   Medications Ordered in ED Medications  0.9 %  sodium chloride infusion (has no administration in time range)  sodium chloride 0.9 % bolus 1,000 mL (has no administration in time range)  dexamethasone (DECADRON) injection 10 mg (has no administration in time range)  metoCLOPramide (REGLAN) injection 10 mg (has no administration in time range)  diphenhydrAMINE (BENADRYL) injection 25 mg (has no administration in time range)  HYDROmorphone (DILAUDID) injection 1 mg (has no administration in time range)    ED Course  I have reviewed the triage vital signs and the nursing notes.  Pertinent labs & imaging results that were available during my care of the patient were reviewed by me and considered in my medical decision making (see chart for details).    MDM Rules/Calculators/A&P                          Patient's blood pressure significantly elevated here but normal renal function.  Normal electrolytes.  We will treat with migraine cocktail.  Although not history of migraines patient still is young.  Having trouble with headaches on and off for about 2 years.  Also will evaluate to see what effect this has on her blood pressure.  Head CT is mandated.  Has been ordered.  Patient also needs pregnancy test.  Patient will receive IV fluids.  If symptoms improved significantly and blood pressure improved some patient can be started on blood pressure medicine.  Patient's been on losartan in the past.  May be a good place to start.  However there is not significant improvement or is any abnormalities on the head CT patient may require admission for hypertensive urgency.  Patient was on the Cozaar 50 mg daily  CT without any acute findings.  Patient's headache is almost completely resolved.  Just has a little bit of residual to  the left temporal area.  May very well be a component of migraines.  This did help improve at least her systolic blood pressures.  They are down to 168.  Diastolics are still above 100.  But overall patient feels better.  We will restart her Cozaar.  She has.she can follow-up with through work but also give her the wellness clinic.  She does have a good Rx card she can use to help with the prescription.  She knows that she needs to have her blood pressure followed closely.  Probably will need some additional adjustments.  Normally blood pressure checked again in a week or 2.  She will return for any new or worse symptoms.  To include any chest pain difficulty breathing or any strokelike symptoms.  But these are not anticipated at her age.    Final Clinical Impression(s) / ED Diagnoses Final diagnoses:  Headache disorder  Primary hypertension    Rx / DC Orders ED Discharge Orders          Ordered    losartan (COZAAR) 50 MG tablet  Daily        06/09/21 1728             Fredia Sorrow, MD 06/09/21 1728    Fredia Sorrow, MD 06/09/21 2000

## 2021-06-09 NOTE — ED Triage Notes (Signed)
Pt tearful.  Reports headache x 3 days with blurred vision.  Taking Excedrin, Tylenol, and Ibuprofen without relief.  PT went for a health screening for a job and was told her blood pressure was high.  Went to [facility] today for a check up and sent to ED.  States she hasn't been to PCP because she has been unemployed. Reports out of BP meds x 1 year and has been trying to do natural remedies at home.

## 2021-06-09 NOTE — ED Provider Notes (Signed)
Emergency Medicine Provider Triage Evaluation Note  [name] , a 35 y.o. female  was evaluated in triage.  Pt complains of headache. States same has been ongoing for 3 days with blurred vision.  States she has been taking copious amounts of Excedrin Tylenol and ibuprofen with some relief.  She went to a health screening last week and was told that her blood pressure was dangerously high and told to schedule an appointment with her primary care.  She called but was unable to get an appointment until next week.  Therefore, she went to urgent care this morning and was referred here for management of her blood pressure.  She states that she used to be on losartan for blood pressure but ran out and has been unable to refill it for the last year.  States she has had intermittent headaches that are similar to these since she went off of her blood pressure medication.   Review of Systems  Positive: Headache, blurred vision Negative: Fevers, chills, nausea, vomiting  Physical Exam  BP (!) 192/123 (BP Location: Left Arm)    Pulse 82    Temp 98.9 F (37.2 C) (Oral)    Resp 18    LMP 05/18/2021    SpO2 100%  Gen:   Awake, no distress   Resp:  Normal effort  MSK:   Moves extremities without difficulty  Other:  PERRLA, overall neurologically intact  Medical Decision Making  Medically screening exam initiated at 12:10 PM.  Appropriate orders placed.  [name] was informed that the remainder of the evaluation will be completed by another provider, this initial triage assessment does not replace that evaluation, and the importance of remaining in the ED until their evaluation is complete.     Vear Clock 06/09/21 1213    Vanetta Mulders, MD 06/13/21 1318

## 2021-06-09 NOTE — Discharge Instructions (Signed)
Headaches may be migraine related.  If you tend to get a recurrent pattern that would be more suggestive.  Follow-up with your new primary care doctor or the wellness clinic.  Need to have blood pressure rechecked in a week or 2.  Take the Cozaar as directed.  You can follow-up with your new doctor may be through work or you can follow-up with the wellness clinic.  Return for any new or worse symptoms.  Return for any strokelike symptoms.  Or any severe chest pain.  One of the medicines you received is Decadron its long-acting.  So recommend resting tonight and resting tomorrow.

## 2021-06-09 NOTE — ED Provider Notes (Signed)
[facility]    CSN: [id number] Arrival date & time: 06/09/21  1018      History   Chief Complaint Chief Complaint  Patient presents with   Hypertension    HPI [name] is a 35 y.o. female presenting with high blood pressures, headaches, blurred vision. BP running 210/145 and 204/127 at home. Headaches wake her up. Blurred vision is new. Denies CP, dizziness, weakness, vision loss. She does have a PCP.  HPI  Past Medical History:  Diagnosis Date   Abdominal hernia    Anxiety    Pregnancy induced hypertension     Patient Active Problem List   Diagnosis Date Noted   Normal pregnancy 01/07/2013   SVD (spontaneous vaginal delivery) 08/20/2011   Gestational hypertension 08/19/2011   CONSTIPATION 03/06/2009   PAIN IN JOINT, LOWER LEG 03/06/2009   FATIGUE 03/06/2009   WEIGHT GAIN, ABNORMAL 03/06/2009   ABDOMINAL PAIN OTHER SPECIFIED SITE 03/28/2008   HEADACHE 03/23/2008   MIGRAINE HEADACHE 06/29/2007   HYPERTENSION, BENIGN ESSENTIAL 06/29/2007   UTI'S, RECURRENT 06/29/2007   ACNE, MILD 06/26/2007    Past Surgical History:  Procedure Laterality Date   NO PAST SURGERIES      OB History     Gravida  3   Para  3   Term  3   Preterm  0   AB  0   Living  3      SAB  0   IAB  0   Ectopic  0   Multiple  0   Live Births  3            Home Medications    Prior to Admission medications   Medication Sig Start Date End Date Taking? Authorizing Provider  ibuprofen (ADVIL) 800 MG tablet Take 1 tablet (800 mg total) by mouth every 8 (eight) hours as needed for moderate pain. 06/05/19   Mullis, Kiersten P, DO  Naproxen Sodium (ALEVE PO) Take 2 tablets by mouth daily as needed (for pain).    [provider]  omeprazole (PRILOSEC) 20 MG capsule Take 1 capsule (20 mg total) by mouth daily. 04/08/16   Shade Flood, MD  Prenatal Vit-Fe Fumarate-FA (PRENATAL MULTIVITAMIN) TABS Take 1 tablet by mouth daily at 12 noon.     [provider]  sucralfate (CARAFATE) 1 GM/10ML suspension Take 10 mLs (1 g total) by mouth 4 (four) times daily -  with meals and at bedtime. 04/08/16   Shade Flood, MD    Family History Family History  Problem Relation Age of Onset   Asthma Mother    Diabetes Maternal Grandmother    Hypertension Maternal Grandmother    Asthma Maternal Grandfather    Anesthesia problems Neg Hx     Social History Social History   Tobacco Use   Smoking status: Never   Smokeless tobacco: Never  Substance Use Topics   Alcohol use: No   Drug use: No     Allergies   Latex   Review of Systems Review of Systems  Neurological:  Positive for headaches.    Physical Exam Triage Vital Signs ED Triage Vitals  Enc Vitals Group     BP 06/09/21 1052 (!) 186/148     Pulse Rate 06/09/21 1052 97     Resp 06/09/21 1052 20     Temp 06/09/21 1052 98.4 F (36.9 C)     Temp src --      SpO2 06/09/21 1052 97 %  Weight --      Height --      Head Circumference --      Peak Flow --      Pain Score 06/09/21 1049 10     Pain Loc --      Pain Edu? --      Excl. in GC? --    No data found.  Updated Vital Signs BP (!) 186/148    Pulse 97    Temp 98.4 F (36.9 C)    Resp 20    LMP 05/18/2021    SpO2 97%   Visual Acuity Right Eye Distance:   Left Eye Distance:   Bilateral Distance:    Right Eye Near:   Left Eye Near:    Bilateral Near:     Physical Exam Vitals reviewed.  Constitutional:      Appearance: Normal appearance. She is not diaphoretic.  HENT:     Head: Normocephalic and atraumatic.     Mouth/Throat:     Mouth: Mucous membranes are moist.  Eyes:     Extraocular Movements: Extraocular movements intact.     Pupils: Pupils are equal, round, and reactive to light.  Cardiovascular:     Rate and Rhythm: Normal rate and regular rhythm.     Pulses:          Radial pulses are 2+ on the right side and 2+ on the left side.     Heart sounds: Normal heart sounds.   Pulmonary:     Effort: Pulmonary effort is normal.     Breath sounds: Normal breath sounds.  Abdominal:     Palpations: Abdomen is soft.     Tenderness: There is no abdominal tenderness. There is no guarding or rebound.  Musculoskeletal:     Right lower leg: No edema.     Left lower leg: No edema.  Skin:    General: Skin is warm.     Capillary Refill: Capillary refill takes less than 2 seconds.  Neurological:     General: No focal deficit present.     Mental Status: She is alert and oriented to person, place, and time.     Cranial Nerves: Cranial nerves 2-12 are intact. No cranial nerve deficit.     Sensory: Sensation is intact.     Motor: Motor function is intact. No weakness.     Coordination: Coordination is intact. Romberg sign negative.     Gait: Gait is intact. Gait normal.     Comments: CN 2-12 grossly intact, PERRLA, EOMI. Fingers to thumb and rhomberg intact.   Psychiatric:        Mood and Affect: Mood normal. Affect is tearful.        Behavior: Behavior normal.        Thought Content: Thought content normal.        Judgment: Judgment normal.     UC Treatments / Results  Labs (all labs ordered are listed, but only abnormal results are displayed) Labs Reviewed - No data to display  EKG   Radiology No results found.  Procedures Procedures (including critical care time)  Medications Ordered in UC Medications - No data to display  Initial Impression / Assessment and Plan / UC Course  I have reviewed the triage vital signs and the nursing notes.  Pertinent labs & imaging results that were available during my care of the patient were reviewed by me and considered in my medical decision making (see chart for details).  This patient is a very pleasant 35 y.o. year old female presenting with hypertensive emergency: BPs ranging 180-210/140s, with headaches and blurred vision. Reassuring neuro exam. Sent to ED via personal vehicle. She is in agreement.  Final  Clinical Impressions(s) / UC Diagnoses   Final diagnoses:  Hypertensive emergency     Discharge Instructions      -Your blood pressure is dangerously high. Plus the symptoms of headaches and blurred vision, you're at risk of complications like stroke. Your blood pressure needs to be lowered and monitored, and then you will need to start on a regimen of blood pressure medications. Please head to the Emergency Department for the initial evaluation. You'll follow-up with your primary care doctor for this in the future.   ED Prescriptions   None    PDMP not reviewed this encounter.   Rhys Martini, PA-C 06/09/21 1104

## 2021-06-09 NOTE — Discharge Instructions (Addendum)
-  Your blood pressure is dangerously high. Plus the symptoms of headaches and blurred vision, you're at risk of complications like stroke. Your blood pressure needs to be lowered and monitored, and then you will need to start on a regimen of blood pressure medications. Please head to the Emergency Department for the initial evaluation. You'll follow-up with your primary care doctor for this in the future.

## 2021-06-14 ENCOUNTER — Other Ambulatory Visit: Payer: Self-pay

## 2021-06-14 ENCOUNTER — Encounter: Payer: Self-pay | Admitting: Family Medicine

## 2021-06-14 ENCOUNTER — Ambulatory Visit (INDEPENDENT_AMBULATORY_CARE_PROVIDER_SITE_OTHER): Payer: 59 | Admitting: Family Medicine

## 2021-06-14 VITALS — BP 140/105 | HR 82 | Temp 98.2°F | Resp 18 | Ht 63.5 in | Wt 194.4 lb

## 2021-06-14 DIAGNOSIS — R519 Headache, unspecified: Secondary | ICD-10-CM

## 2021-06-14 DIAGNOSIS — I10 Essential (primary) hypertension: Secondary | ICD-10-CM | POA: Diagnosis not present

## 2021-06-14 DIAGNOSIS — H538 Other visual disturbances: Secondary | ICD-10-CM | POA: Diagnosis not present

## 2021-06-14 LAB — POCT URINALYSIS DIP (MANUAL ENTRY)
Bilirubin, UA: NEGATIVE
Glucose, UA: NEGATIVE mg/dL
Ketones, POC UA: NEGATIVE mg/dL
Nitrite, UA: NEGATIVE
Spec Grav, UA: 1.015 (ref 1.010–1.025)
Urobilinogen, UA: 0.2 E.U./dL
pH, UA: 6 (ref 5.0–8.0)

## 2021-06-14 NOTE — Progress Notes (Signed)
Subjective:  Patient ID: [name], female    DOB: [birth year]  Age: 35 y.o. MRN: [id number]  CC:  Chief Complaint  Patient presents with   New Patient (Initial Visit)    Patient states she is here to establish care. Pt states she is here for the migraines and BP has been elevated.    HPI [name] presents for  New patient to establish care and evaluation of elevated blood pressure, headaches.  Past medical history reviewed.  Migraine headache, hypertension, gestational hypertension per history. Treated by urgent care in 2019 for HA and HTN.   ER note reviewed on December 17.  Headache at that time persistent for 3 days but headache on and off for 2 years prior. BMP was normal.  Treated with dexamethasone, diphenhydramine, IV fluid, Reglan, hydromorphone, CT head with mild sinus disease but no acute intracranial abnormalities.  Blood pressure was 192/123 then repeat 174/119.  She had not been on medication at that time, treated with losartan previously.  Restarted on losartan 50 mg daily.  Headache improved during the ER visit.  Hypertension: As above.  Mother, grandmother with HTN, maternal GM with hypothyroidism.  No FH of early CAD, MI, CVA or renal disease.  No tobacco.  Few glasses of wine per week.  On losartan 50mg  qd past 5 days.  for Cytogeneticist. On computer. 3 children (8,9.14), boyfriend at home helps.  Headaches most days, some stress HA.  Some nausea. LMP - on currently.  No chest pain, no focal weakness. episodic blurry vision with more severe HA, no loss of vision. No current blurry vision.   No HA specialist.  Home readings: 171/113, 169/113. 168/101.  BP Readings from Last 3 Encounters:  06/14/21 (!) 140/105  06/09/21 (!) 176/111  06/09/21 (!) 186/148   Lab Results  Component Value Date   CREATININE 0.69 06/09/2021        History Patient Active Problem List   Diagnosis Date Noted   Normal pregnancy 01/07/2013   SVD  (spontaneous vaginal delivery) 08/20/2011   Gestational hypertension 08/19/2011   CONSTIPATION 03/06/2009   PAIN IN JOINT, LOWER LEG 03/06/2009   FATIGUE 03/06/2009   WEIGHT GAIN, ABNORMAL 03/06/2009   ABDOMINAL PAIN OTHER SPECIFIED SITE 03/28/2008   Headache 03/23/2008   MIGRAINE HEADACHE 06/29/2007   HYPERTENSION, BENIGN ESSENTIAL 06/29/2007   UTI'S, RECURRENT 06/29/2007   ACNE, MILD 06/26/2007   Past Medical History:  Diagnosis Date   Abdominal hernia    Anxiety    Pregnancy induced hypertension    Past Surgical History:  Procedure Laterality Date   NO PAST SURGERIES     Allergies  Allergen Reactions   Latex Hives, Itching and Rash   Prior to Admission medications   Medication Sig Start Date End Date Taking? Authorizing Provider  losartan (COZAAR) 50 MG tablet Take 1 tablet (50 mg total) by mouth daily. 06/09/21  Yes 06/11/21, MD  losartan (COZAAR) 50 MG tablet Take 1 tablet (50 mg total) by mouth daily. 06/09/21  Yes 06/11/21, MD  Naproxen Sodium (ALEVE PO) Take 2 tablets by mouth daily as needed (for pain).   Yes [provider]  sucralfate (CARAFATE) 1 GM/10ML suspension Take 10 mLs (1 g total) by mouth 4 (four) times daily -  with meals and at bedtime. 04/08/16  Yes 04/10/16, MD  ibuprofen (ADVIL) 800 MG tablet Take 1 tablet (800 mg total) by mouth every 8 (eight) hours as needed for moderate  pain. Patient not taking: Reported on 06/14/2021 06/05/19   Orpah Cobb P, DO  omeprazole (PRILOSEC) 20 MG capsule Take 1 capsule (20 mg total) by mouth daily. Patient not taking: Reported on 06/14/2021 04/08/16   Shade Flood, MD  Prenatal Vit-Fe Fumarate-FA (PRENATAL MULTIVITAMIN) TABS Take 1 tablet by mouth daily at 12 noon. Patient not taking: Reported on 06/14/2021    [provider]   Social History   Socioeconomic History   Marital status: Single    Spouse name: Not on file   Number of children: Not on file   Years  of education: Not on file   Highest education level: Not on file  Occupational History   Not on file  Tobacco Use   Smoking status: Never   Smokeless tobacco: Never  Substance and Sexual Activity   Alcohol use: No   Drug use: No   Sexual activity: Yes    Birth control/protection: None    Comment: Just stopped breast feeding  Other Topics Concern   Not on file  Social History Narrative   Not on file   Social Determinants of Health   Financial Resource Strain: Not on file  Food Insecurity: Not on file  Transportation Needs: Not on file  Physical Activity: Not on file  Stress: Not on file  Social Connections: Not on file  Intimate Partner Violence: Not on file    Review of Systems Per HPI  Objective:   Vitals:   06/14/21 1020 06/14/21 1059  BP: (!) 168/106 (!) 140/105  Pulse: 82   Resp: 18   Temp: 98.2 F (36.8 C)   TempSrc: Temporal   Weight: 194 lb 6.4 oz (88.2 kg)   Height: 5' 3.5" (1.613 m)      Physical Exam Vitals reviewed.  Constitutional:      Appearance: Normal appearance. She is well-developed.  HENT:     Head: Normocephalic and atraumatic.  Eyes:     Extraocular Movements: Extraocular movements intact.     Conjunctiva/sclera: Conjunctivae normal.     Pupils: Pupils are equal, round, and reactive to light.  Neck:     Vascular: No carotid bruit.  Cardiovascular:     Rate and Rhythm: Normal rate and regular rhythm.     Heart sounds: Normal heart sounds.  Pulmonary:     Effort: Pulmonary effort is normal.     Breath sounds: Normal breath sounds.  Abdominal:     Palpations: Abdomen is soft. There is no pulsatile mass.     Tenderness: There is no abdominal tenderness.  Musculoskeletal:     Right lower leg: No edema.     Left lower leg: No edema.  Skin:    General: Skin is warm and dry.  Neurological:     General: No focal deficit present.     Mental Status: She is alert and oriented to person, place, and time.     GCS: GCS eye subscore is  4. GCS verbal subscore is 5. GCS motor subscore is 6.     Cranial Nerves: No cranial nerve deficit, dysarthria or facial asymmetry.     Sensory: No sensory deficit.     Motor: Motor function is intact. No weakness, tremor or pronator drift.     Coordination: Romberg sign negative. Coordination normal. Heel to Shin Test normal.     Gait: Gait is intact.  Psychiatric:        Mood and Affect: Mood normal.        Behavior:  Behavior normal.    Results for orders placed or performed in visit on 06/14/21  POCT urinalysis dipstick  Result Value Ref Range   Color, UA yellow yellow   Clarity, UA cloudy (A) clear   Glucose, UA negative negative mg/dL   Bilirubin, UA negative negative   Ketones, POC UA negative negative mg/dL   Spec Grav, UA 1.610 9.604 - 1.025   Blood, UA moderate (A) negative   pH, UA 6.0 5.0 - 8.0   Protein Ur, POC trace (A) negative mg/dL   Urobilinogen, UA 0.2 0.2 or 1.0 E.U./dL   Nitrite, UA Negative Negative   Leukocytes, UA Trace (A) Negative   Currently on menses.    Assessment & Plan:  [name] is a 35 y.o. female . Nonintractable episodic headache, unspecified headache type - Plan: AMB referral to headache clinic  HYPERTENSION, BENIGN ESSENTIAL - Plan: POCT urinalysis dipstick  Blurry vision - Plan: Ambulatory referral to Ophthalmology  Uncontrolled hypertension, likely contributing to headache versus secondary headache disorder/migraines.  Intermittent blurry vision, asymptomatic at this time.  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  Blood pressure improved from ER but still elevated.  Continue losartan 50 mg with option to increase to 100 mg if still elevated readings in the next few days.  Creatinine reassuring from ER.  Will need to have rechecked in the next week as based on her elevated readings and age renal artery  stenosis in differential.  Repeat creatinine at that time.  Will check thyroid next visit as well and can look into other secondary causes of hypertension if needed.  ER/RTC precautions given.  Headache should improve as blood pressure improves but will refer to headache specialist with chronic headaches, possible underlying headache disorder versus migraines.  Nonfocal exam at this time.  ER precautions.  No orders of the defined types were placed in this encounter.  Patient Instructions  I will refer you to headache specialist, eye specialist.  If blood pressure not improved to below 140/90 in next few days, increase losartan to 2 pills per day.  Recheck in 1 week for repeat blood pressure check and repeat kidney test and a thyroid test.  Return to the clinic or go to the nearest emergency room if any of your symptoms worsen or new symptoms occur. General Headache Without Cause A headache is pain or discomfort felt around the head or neck area. There are many causes and types of headaches. A few common types include: Tension headaches. Migraine headaches. Cluster headaches. Chronic daily headaches. Sometimes, the specific cause of a headache may not be found. Follow these instructions at home: Watch your condition for any changes. Let your health care provider know about them. Take these steps to help with your condition: Managing pain   Take over-the-counter and prescription medicines only as told by your health care provider. Treatment may include medicines for pain that are taken by mouth or applied to the skin. Lie down in a dark, quiet room when you have a headache. Keep lights dim if bright lights bother you or make your headaches worse. If directed, put ice on your head and neck area: Put ice in a plastic bag. Place a towel between your skin and the bag. Leave the ice on for 20 minutes, 2-3 times per day. Remove the ice if your skin turns bright red. This is very important. If  you  cannot feel pain, heat, or cold, you have a greater risk of damage to the area. If directed, apply heat to the affected area. Use the heat source that your health care provider recommends, such as a moist heat pack or a heating pad. Place a towel between your skin and the heat source. Leave the heat on for 20-30 minutes. Remove the heat if your skin turns bright red. This is especially important if you are unable to feel pain, heat, or cold. You have a greater risk of getting burned. Eating and drinking Eat meals on a regular schedule. If you drink alcohol: Limit how much you have to: 0-1 drink a day for women who are not pregnant. 0-2 drinks a day for men. Know how much alcohol is in a drink. In the [place] one drink equals one 12 oz bottle of beer (355 mL), one 5 oz glass of wine (148 mL), or one 1 oz glass of hard liquor (44 mL). Stop drinking caffeine, or decrease the amount of caffeine you drink. Drink enough fluid to keep your urine pale yellow. General instructions  Keep a headache journal to help find out what may trigger your headaches. For example, write down: What you eat and drink. How much sleep you get. Any change to your diet or medicines. Try massage or other relaxation techniques. Limit stress. Sit up straight, and do not tense your muscles. Do not use any products that contain nicotine or tobacco. These products include cigarettes, chewing tobacco, and vaping devices, such as e-cigarettes. If you need help quitting, ask your health care provider. Exercise regularly as told by your health care provider. Sleep on a regular schedule. Get 7-9 hours of sleep each night, or the amount recommended by your health care provider. Keep all follow-up visits. This is important. Contact a health care provider if: Medicine does not help your symptoms. You have a headache that is different from your usual headache. You have nausea or you vomit. You have a fever. Get help right  away if: Your headache: Becomes severe quickly. Gets worse after moderate to intense physical activity. You have any of these symptoms: Repeated vomiting. Pain or stiffness in your neck. Changes to your vision. Pain in an eye or ear. Problems with speech. Muscular weakness or loss of muscle control. Loss of balance or coordination. You feel faint or pass out. You have confusion. You have a seizure. These symptoms may represent a serious problem that is an emergency. Do not wait to see if the symptoms will go away. Get medical help right away. Call your local emergency services (911 in the [place] Do not drive yourself to the hospital. Summary A headache is pain or discomfort felt around the head or neck area. There are many causes and types of headaches. In some cases, the cause may not be found. Keep a headache journal to help find out what may trigger your headaches. Watch your condition for any changes. Let your health care provider know about them. Contact a health care provider if you have a headache that is different from the usual headache, or if your symptoms are not helped by medicine. Get help right away if your headache becomes severe, you vomit, you have a loss of vision, you lose your balance, or you have a seizure. This information is not intended to replace advice given to you by your health care provider. Make sure you discuss any questions you have with your health care provider. Document Revised: 11/08/2020 Document  Reviewed: 11/08/2020 Elsevier Patient Education  2022 [employer]vier Inc.   Managing Your Hypertension Hypertension, also called high blood pressure, is when the force of the blood pressing against the walls of the arteries is too strong. Arteries are blood vessels that carry blood from your heart throughout your body. Hypertension forces the heart to work harder to pump blood and may cause the arteries to become narrow or stiff. Understanding blood pressure  readings Your personal target blood pressure may vary depending on your medical conditions, your age, and other factors. A blood pressure reading includes a higher number over a lower number. Ideally, your blood pressure should be below 120/80. You should know that: The first, or top, number is called the systolic pressure. It is a measure of the pressure in your arteries as your heart beats. The second, or bottom number, is called the diastolic pressure. It is a measure of the pressure in your arteries as the heart relaxes. Blood pressure is classified into four stages. Based on your blood pressure reading, your health care provider may use the following stages to determine what type of treatment you need, if any. Systolic pressure and diastolic pressure are measured in a unit called mmHg. Normal Systolic pressure: below 120. Diastolic pressure: below 80. Elevated Systolic pressure: 120-129. Diastolic pressure: below 80. Hypertension stage 1 Systolic pressure: 130-139. Diastolic pressure: 80-89. Hypertension stage 2 Systolic pressure: 140 or above. Diastolic pressure: 90 or above. How can this condition affect me? Managing your hypertension is an important responsibility. Over time, hypertension can damage the arteries and decrease blood flow to important parts of the body, including the brain, heart, and kidneys. Having untreated or uncontrolled hypertension can lead to: A heart attack. A stroke. A weakened blood vessel (aneurysm). Heart failure. Kidney damage. Eye damage. Metabolic syndrome. Memory and concentration problems. Vascular dementia. What actions can I take to manage this condition? Hypertension can be managed by making lifestyle changes and possibly by taking medicines. Your health care provider will help you make a plan to bring your blood pressure within a normal range. Nutrition  Eat a diet that is high in fiber and potassium, and low in salt (sodium), added sugar,  and fat. An example eating plan is called the Dietary Approaches to Stop Hypertension (DASH) diet. To eat this way: Eat plenty of fresh fruits and vegetables. Try to fill one-half of your plate at each meal with fruits and vegetables. Eat whole grains, such as whole-wheat pasta, brown rice, or whole-grain bread. Fill about one-fourth of your plate with whole grains. Eat low-fat dairy products. Avoid fatty cuts of meat, processed or cured meats, and poultry with skin. Fill about one-fourth of your plate with lean proteins such as fish, chicken without skin, beans, eggs, and tofu. Avoid pre-made and processed foods. These tend to be higher in sodium, added sugar, and fat. Reduce your daily sodium intake. Most people with hypertension should eat less than 1,500 mg of sodium a day. Lifestyle  Work with your health care provider to maintain a healthy body weight or to lose weight. Ask what an ideal weight is for you. Get at least 30 minutes of exercise that causes your heart to beat faster (aerobic exercise) most days of the week. Activities may include walking, swimming, or biking. Include exercise to strengthen your muscles (resistance exercise), such as weight lifting, as part of your weekly exercise routine. Try to do these types of exercises for 30 minutes at least 3 days a week. Do not  use any products that contain nicotine or tobacco, such as cigarettes, e-cigarettes, and chewing tobacco. If you need help quitting, ask your health care provider. Control any long-term (chronic) conditions you have, such as high cholesterol or diabetes. Identify your sources of stress and find ways to manage stress. This may include meditation, deep breathing, or making time for fun activities. Alcohol use Do not drink alcohol if: Your health care provider tells you not to drink. You are pregnant, may be pregnant, or are planning to become pregnant. If you drink alcohol: Limit how much you use to: 0-1 drink a  day for women. 0-2 drinks a day for men. Be aware of how much alcohol is in your drink. In the [place] one drink equals one 12 oz bottle of beer (355 mL), one 5 oz glass of wine (148 mL), or one 1 oz glass of hard liquor (44 mL). Medicines Your health care provider may prescribe medicine if lifestyle changes are not enough to get your blood pressure under control and if: Your systolic blood pressure is 130 or higher. Your diastolic blood pressure is 80 or higher. Take medicines only as told by your health care provider. Follow the directions carefully. Blood pressure medicines must be taken as told by your health care provider. The medicine does not work as well when you skip doses. Skipping doses also puts you at risk for problems. Monitoring Before you monitor your blood pressure: Do not smoke, drink caffeinated beverages, or exercise within 30 minutes before taking a measurement. Use the bathroom and empty your bladder (urinate). Sit quietly for at least 5 minutes before taking measurements. Monitor your blood pressure at home as told by your health care provider. To do this: Sit with your back straight and supported. Place your feet flat on the floor. Do not cross your legs. Support your arm on a flat surface, such as a table. Make sure your upper arm is at heart level. Each time you measure, take two or three readings one minute apart and record the results. You may also need to have your blood pressure checked regularly by your health care provider. General information Talk with your health care provider about your diet, exercise habits, and other lifestyle factors that may be contributing to hypertension. Review all the medicines you take with your health care provider because there may be side effects or interactions. Keep all visits as told by your health care provider. Your health care provider can help you create and adjust your plan for managing your high blood pressure. Where to  find more information National Heart, Lung, and Blood Institute: [employer].is American Heart Association: www.heart.org Contact a health care provider if: You think you are having a reaction to medicines you have taken. You have repeated (recurrent) headaches. You feel dizzy. You have swelling in your ankles. You have trouble with your vision. Get help right away if: You develop a severe headache or confusion. You have unusual weakness or numbness, or you feel faint. You have severe pain in your chest or abdomen. You vomit repeatedly. You have trouble breathing. These symptoms may represent a serious problem that is an emergency. Do not wait to see if the symptoms will go away. Get medical help right away. Call your local emergency services (911 in the [place] Do not drive yourself to the hospital. Summary Hypertension is when the force of blood pumping through your arteries is too strong. If this condition is not controlled, it may put you at risk  for serious complications. Your personal target blood pressure may vary depending on your medical conditions, your age, and other factors. For most people, a normal blood pressure is less than 120/80. Hypertension is managed by lifestyle changes, medicines, or both. Lifestyle changes to help manage hypertension include losing weight, eating a healthy, low-sodium diet, exercising more, stopping smoking, and limiting alcohol. This information is not intended to replace advice given to you by your health care provider. Make sure you discuss any questions you have with your health care provider. Document Revised: 06/28/2019 Document Reviewed: 05/11/2019 Elsevier Patient Education  2022 [employer].      If you have lab work done today you will be contacted with your lab results within the next 2 weeks.  If you have not heard from [place] then please contact us. The fastest way to get your results is to register for My Chart.   IF you received  an x-ray today, you will receive an invoice from [facility]. Please contact [facility] at [phone] with questions or concerns regarding your invoice.   IF you received labwork today, you will receive an invoice from [facility]. Please contact [facility] at [phone] with questions or concerns regarding your invoice.   Our billing staff will not be able to assist you with questions regarding bills from these companies.  You will be contacted with the lab results as soon as they are available. The fastest way to get your results is to activate your My Chart account. Instructions are located on the last page of this paperwork. If you have not heard from [place] regarding the results in 2 weeks, please contact this office.        Signed,   Meredith Staggers, MD [facility], [facility] 06/14/21 11:31 AM

## 2021-06-14 NOTE — Patient Instructions (Addendum)
I will refer you to headache specialist, eye specialist.  If blood pressure not improved to below 140/90 in next few days, increase losartan to 2 pills per day.  Recheck in 1 week for repeat blood pressure check and repeat kidney test and a thyroid test.  Return to the clinic or go to the nearest emergency room if any of your symptoms worsen or new symptoms occur. General Headache Without Cause A headache is pain or discomfort felt around the head or neck area. There are many causes and types of headaches. A few common types include: Tension headaches. Migraine headaches. Cluster headaches. Chronic daily headaches. Sometimes, the specific cause of a headache may not be found. Follow these instructions at home: Watch your condition for any changes. Let your health care provider know about them. Take these steps to help with your condition: Managing pain   Take over-the-counter and prescription medicines only as told by your health care provider. Treatment may include medicines for pain that are taken by mouth or applied to the skin. Lie down in a dark, quiet room when you have a headache. Keep lights dim if bright lights bother you or make your headaches worse. If directed, put ice on your head and neck area: Put ice in a plastic bag. Place a towel between your skin and the bag. Leave the ice on for 20 minutes, 2-3 times per day. Remove the ice if your skin turns bright red. This is very important. If you cannot feel pain, heat, or cold, you have a greater risk of damage to the area. If directed, apply heat to the affected area. Use the heat source that your health care provider recommends, such as a moist heat pack or a heating pad. Place a towel between your skin and the heat source. Leave the heat on for 20-30 minutes. Remove the heat if your skin turns bright red. This is especially important if you are unable to feel pain, heat, or cold. You have a greater risk of getting  burned. Eating and drinking Eat meals on a regular schedule. If you drink alcohol: Limit how much you have to: 0-1 drink a day for women who are not pregnant. 0-2 drinks a day for men. Know how much alcohol is in a drink. In the [place] one drink equals one 12 oz bottle of beer (355 mL), one 5 oz glass of wine (148 mL), or one 1 oz glass of hard liquor (44 mL). Stop drinking caffeine, or decrease the amount of caffeine you drink. Drink enough fluid to keep your urine pale yellow. General instructions  Keep a headache journal to help find out what may trigger your headaches. For example, write down: What you eat and drink. How much sleep you get. Any change to your diet or medicines. Try massage or other relaxation techniques. Limit stress. Sit up straight, and do not tense your muscles. Do not use any products that contain nicotine or tobacco. These products include cigarettes, chewing tobacco, and vaping devices, such as e-cigarettes. If you need help quitting, ask your health care provider. Exercise regularly as told by your health care provider. Sleep on a regular schedule. Get 7-9 hours of sleep each night, or the amount recommended by your health care provider. Keep all follow-up visits. This is important. Contact a health care provider if: Medicine does not help your symptoms. You have a headache that is different from your usual headache. You have nausea or you vomit. You have a fever. Get  help right away if: Your headache: Becomes severe quickly. Gets worse after moderate to intense physical activity. You have any of these symptoms: Repeated vomiting. Pain or stiffness in your neck. Changes to your vision. Pain in an eye or ear. Problems with speech. Muscular weakness or loss of muscle control. Loss of balance or coordination. You feel faint or pass out. You have confusion. You have a seizure. These symptoms may represent a serious problem that is an emergency. Do  not wait to see if the symptoms will go away. Get medical help right away. Call your local emergency services (911 in the [place] Do not drive yourself to the hospital. Summary A headache is pain or discomfort felt around the head or neck area. There are many causes and types of headaches. In some cases, the cause may not be found. Keep a headache journal to help find out what may trigger your headaches. Watch your condition for any changes. Let your health care provider know about them. Contact a health care provider if you have a headache that is different from the usual headache, or if your symptoms are not helped by medicine. Get help right away if your headache becomes severe, you vomit, you have a loss of vision, you lose your balance, or you have a seizure. This information is not intended to replace advice given to you by your health care provider. Make sure you discuss any questions you have with your health care provider. Document Revised: 11/08/2020 Document Reviewed: 11/08/2020 Elsevier Patient Education  2022 [employer]   Managing Your Hypertension Hypertension, also called high blood pressure, is when the force of the blood pressing against the walls of the arteries is too strong. Arteries are blood vessels that carry blood from your heart throughout your body. Hypertension forces the heart to work harder to pump blood and may cause the arteries to become narrow or stiff. Understanding blood pressure readings Your personal target blood pressure may vary depending on your medical conditions, your age, and other factors. A blood pressure reading includes a higher number over a lower number. Ideally, your blood pressure should be below 120/80. You should know that: The first, or top, number is called the systolic pressure. It is a measure of the pressure in your arteries as your heart beats. The second, or bottom number, is called the diastolic pressure. It is a measure of the pressure  in your arteries as the heart relaxes. Blood pressure is classified into four stages. Based on your blood pressure reading, your health care provider may use the following stages to determine what type of treatment you need, if any. Systolic pressure and diastolic pressure are measured in a unit called mmHg. Normal Systolic pressure: below 120. Diastolic pressure: below 80. Elevated Systolic pressure: 120-129. Diastolic pressure: below 80. Hypertension stage 1 Systolic pressure: 130-139. Diastolic pressure: 80-89. Hypertension stage 2 Systolic pressure: 140 or above. Diastolic pressure: 90 or above. How can this condition affect me? Managing your hypertension is an important responsibility. Over time, hypertension can damage the arteries and decrease blood flow to important parts of the body, including the brain, heart, and kidneys. Having untreated or uncontrolled hypertension can lead to: A heart attack. A stroke. A weakened blood vessel (aneurysm). Heart failure. Kidney damage. Eye damage. Metabolic syndrome. Memory and concentration problems. Vascular dementia. What actions can I take to manage this condition? Hypertension can be managed by making lifestyle changes and possibly by taking medicines. Your health care provider will help you  make a plan to bring your blood pressure within a normal range. Nutrition  Eat a diet that is high in fiber and potassium, and low in salt (sodium), added sugar, and fat. An example eating plan is called the Dietary Approaches to Stop Hypertension (DASH) diet. To eat this way: Eat plenty of fresh fruits and vegetables. Try to fill one-half of your plate at each meal with fruits and vegetables. Eat whole grains, such as whole-wheat pasta, brown rice, or whole-grain bread. Fill about one-fourth of your plate with whole grains. Eat low-fat dairy products. Avoid fatty cuts of meat, processed or cured meats, and poultry with skin. Fill about  one-fourth of your plate with lean proteins such as fish, chicken without skin, beans, eggs, and tofu. Avoid pre-made and processed foods. These tend to be higher in sodium, added sugar, and fat. Reduce your daily sodium intake. Most people with hypertension should eat less than 1,500 mg of sodium a day. Lifestyle  Work with your health care provider to maintain a healthy body weight or to lose weight. Ask what an ideal weight is for you. Get at least 30 minutes of exercise that causes your heart to beat faster (aerobic exercise) most days of the week. Activities may include walking, swimming, or biking. Include exercise to strengthen your muscles (resistance exercise), such as weight lifting, as part of your weekly exercise routine. Try to do these types of exercises for 30 minutes at least 3 days a week. Do not use any products that contain nicotine or tobacco, such as cigarettes, e-cigarettes, and chewing tobacco. If you need help quitting, ask your health care provider. Control any long-term (chronic) conditions you have, such as high cholesterol or diabetes. Identify your sources of stress and find ways to manage stress. This may include meditation, deep breathing, or making time for fun activities. Alcohol use Do not drink alcohol if: Your health care provider tells you not to drink. You are pregnant, may be pregnant, or are planning to become pregnant. If you drink alcohol: Limit how much you use to: 0-1 drink a day for women. 0-2 drinks a day for men. Be aware of how much alcohol is in your drink. In the [place] one drink equals one 12 oz bottle of beer (355 mL), one 5 oz glass of wine (148 mL), or one 1 oz glass of hard liquor (44 mL). Medicines Your health care provider may prescribe medicine if lifestyle changes are not enough to get your blood pressure under control and if: Your systolic blood pressure is 130 or higher. Your diastolic blood pressure is 80 or higher. Take medicines  only as told by your health care provider. Follow the directions carefully. Blood pressure medicines must be taken as told by your health care provider. The medicine does not work as well when you skip doses. Skipping doses also puts you at risk for problems. Monitoring Before you monitor your blood pressure: Do not smoke, drink caffeinated beverages, or exercise within 30 minutes before taking a measurement. Use the bathroom and empty your bladder (urinate). Sit quietly for at least 5 minutes before taking measurements. Monitor your blood pressure at home as told by your health care provider. To do this: Sit with your back straight and supported. Place your feet flat on the floor. Do not cross your legs. Support your arm on a flat surface, such as a table. Make sure your upper arm is at heart level. Each time you measure, take two or three readings  one minute apart and record the results. You may also need to have your blood pressure checked regularly by your health care provider. General information Talk with your health care provider about your diet, exercise habits, and other lifestyle factors that may be contributing to hypertension. Review all the medicines you take with your health care provider because there may be side effects or interactions. Keep all visits as told by your health care provider. Your health care provider can help you create and adjust your plan for managing your high blood pressure. Where to find more information National Heart, Lung, and Blood Institute: [employer] American Heart Association: www.heart.org Contact a health care provider if: You think you are having a reaction to medicines you have taken. You have repeated (recurrent) headaches. You feel dizzy. You have swelling in your ankles. You have trouble with your vision. Get help right away if: You develop a severe headache or confusion. You have unusual weakness or numbness, or you feel  faint. You have severe pain in your chest or abdomen. You vomit repeatedly. You have trouble breathing. These symptoms may represent a serious problem that is an emergency. Do not wait to see if the symptoms will go away. Get medical help right away. Call your local emergency services (911 in the [place] Do not drive yourself to the hospital. Summary Hypertension is when the force of blood pumping through your arteries is too strong. If this condition is not controlled, it may put you at risk for serious complications. Your personal target blood pressure may vary depending on your medical conditions, your age, and other factors. For most people, a normal blood pressure is less than 120/80. Hypertension is managed by lifestyle changes, medicines, or both. Lifestyle changes to help manage hypertension include losing weight, eating a healthy, low-sodium diet, exercising more, stopping smoking, and limiting alcohol. This information is not intended to replace advice given to you by your health care provider. Make sure you discuss any questions you have with your health care provider. Document Revised: 06/28/2019 Document Reviewed: 05/11/2019 Elsevier Patient Education  2022 [employer].      If you have lab work done today you will be contacted with your lab results within the next 2 weeks.  If you have not heard from [place] then please contact us. The fastest way to get your results is to register for My Chart.   IF you received an x-ray today, you will receive an invoice from [facility]. Please contact [facility] at [phone] with questions or concerns regarding your invoice.   IF you received labwork today, you will receive an invoice from [facility]. Please contact [facility] at [phone] with questions or concerns regarding your invoice.   Our billing staff will not be able to assist you with questions regarding bills from these companies.  You will be contacted with  the lab results as soon as they are available. The fastest way to get your results is to activate your My Chart account. Instructions are located on the last page of this paperwork. If you have not heard from [place] regarding the results in 2 weeks, please contact this office.

## 2021-06-21 ENCOUNTER — Ambulatory Visit (INDEPENDENT_AMBULATORY_CARE_PROVIDER_SITE_OTHER): Payer: 59 | Admitting: Family Medicine

## 2021-06-21 VITALS — BP 140/88 | HR 83 | Temp 98.2°F | Resp 17 | Ht 63.5 in | Wt 196.0 lb

## 2021-06-21 DIAGNOSIS — R519 Headache, unspecified: Secondary | ICD-10-CM | POA: Diagnosis not present

## 2021-06-21 DIAGNOSIS — I10 Essential (primary) hypertension: Secondary | ICD-10-CM

## 2021-06-21 NOTE — Progress Notes (Signed)
Subjective:  Patient ID: [name], female    DOB: [birth year]  Age: 35 y.o. MRN: [id number]  CC:  Chief Complaint  Patient presents with   Hypertension    Reports doubled dose on Saturday ranging [id number] and diastolic has been under [id number], pt reports has had headaches and lightheadedness     HPI [name] presents for   Hypertension: Follow-up from 1 week ago and new patient visit.  Headache, elevated blood pressure at that time.  Has been on losartan 50 mg daily for 5 days.  Uncontrolled hypertension likely cause of secondary headache versus headache disorder/migraines.  Did report intermittent blurry vision but asymptomatic at her last visit.  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  Option of 100 mg losartan if persistent elevations.  Plan for recheck creatinine given start of ARB and her age to rule out renal artery stenosis.  Has increased to 100mg  losartan past 5-6 days (160/100). Now better 147/90's at home. HA about the same. No new focal weakness. No diplopia.  On list for HA specialist - appt in February - trying for sooner appt. Also has HA specialist eval through virtual appt at work?   Some fatigue/sleepy with higher dose meds. No chest pain. Lightheaded at times with HA or sleepy - better with nap.  Drinking fluids, urinating normally. No dysuria, hematuria - on menses last visit.   Some stress at work. Some more HA with work. Some increased fatigue since last week. Some increased stressors. Took 2 days off work, off Monday.  Sleeping ok at night. 9.5 hrs sleep. No snoring, no hx of OSA.  Tx: excedrin migraine helps. Ibuprofen elevates BP, no relief with tylenol.   Home readings: BP Readings from Last 3 Encounters:  06/21/21 140/88  06/14/21 (!) 140/105  06/09/21 (!) 176/111   Lab Results  Component Value Date   CREATININE 0.69 06/09/2021     History Patient Active Problem List   Diagnosis Date Noted   Normal pregnancy 01/07/2013   SVD  (spontaneous vaginal delivery) 08/20/2011   Gestational hypertension 08/19/2011   CONSTIPATION 03/06/2009   PAIN IN JOINT, LOWER LEG 03/06/2009   FATIGUE 03/06/2009   WEIGHT GAIN, ABNORMAL 03/06/2009   ABDOMINAL PAIN OTHER SPECIFIED SITE 03/28/2008   Headache 03/23/2008   MIGRAINE HEADACHE 06/29/2007   HYPERTENSION, BENIGN ESSENTIAL 06/29/2007   UTI'S, RECURRENT 06/29/2007   ACNE, MILD 06/26/2007   Past Medical History:  Diagnosis Date   Abdominal hernia    Anxiety    Pregnancy induced hypertension    Past Surgical History:  Procedure Laterality Date   NO PAST SURGERIES     Allergies  Allergen Reactions   Latex Hives, Itching and Rash   Prior to Admission medications   Medication Sig Start Date End Date Taking? Authorizing Provider  ibuprofen (ADVIL) 800 MG tablet Take 1 tablet (800 mg total) by mouth every 8 (eight) hours as needed for moderate pain. 06/05/19  Yes Mullis, Kiersten P, DO  losartan (COZAAR) 50 MG tablet Take 1 tablet (50 mg total) by mouth daily. 06/09/21  Yes Fredia Sorrow, MD  losartan (COZAAR) 50 MG tablet Take 1 tablet (50 mg total) by mouth daily. Patient taking differently: Take 50 mg by mouth daily. Taking 100 mg in am currently 06/09/21  Yes Fredia Sorrow, MD  Naproxen Sodium (ALEVE PO) Take 2 tablets by mouth daily as needed (for pain).   Yes [provider]  omeprazole (PRILOSEC) 20 MG capsule Take 1 capsule (20 mg  total) by mouth daily. 04/08/16  Yes Shade Flood, MD  Prenatal Vit-Fe Fumarate-FA (PRENATAL MULTIVITAMIN) TABS Take 1 tablet by mouth daily at 12 noon.   Yes [provider]  sucralfate (CARAFATE) 1 GM/10ML suspension Take 10 mLs (1 g total) by mouth 4 (four) times daily -  with meals and at bedtime. 04/08/16  Yes Shade Flood, MD   Social History   Socioeconomic History   Marital status: Single    Spouse name: Not on file   Number of children: Not on file   Years of education: Not on file   Highest  education level: Not on file  Occupational History   Not on file  Tobacco Use   Smoking status: Never   Smokeless tobacco: Never  Substance and Sexual Activity   Alcohol use: No   Drug use: No   Sexual activity: Yes    Birth control/protection: None    Comment: Just stopped breast feeding  Other Topics Concern   Not on file  Social History Narrative   Not on file   Social Determinants of Health   Financial Resource Strain: Not on file  Food Insecurity: Not on file  Transportation Needs: Not on file  Physical Activity: Not on file  Stress: Not on file  Social Connections: Not on file  Intimate Partner Violence: Not on file    Review of Systems Per HPI.   Objective:   Vitals:   06/21/21 1612  BP: 140/88  Pulse: 83  Resp: 17  Temp: 98.2 F (36.8 C)  TempSrc: Temporal  SpO2: 97%  Weight: 196 lb (88.9 kg)  Height: 5' 3.5" (1.613 m)     Physical Exam Vitals reviewed.  Constitutional:      Appearance: Normal appearance. She is well-developed.  HENT:     Head: Normocephalic and atraumatic.  Eyes:     Conjunctiva/sclera: Conjunctivae normal.     Pupils: Pupils are equal, round, and reactive to light.  Neck:     Vascular: No carotid bruit.  Cardiovascular:     Rate and Rhythm: Normal rate and regular rhythm.     Heart sounds: Normal heart sounds.  Pulmonary:     Effort: Pulmonary effort is normal.     Breath sounds: Normal breath sounds.  Abdominal:     Palpations: Abdomen is soft. There is no pulsatile mass.     Tenderness: There is no abdominal tenderness.  Musculoskeletal:     Right lower leg: No edema.     Left lower leg: No edema.  Skin:    General: Skin is warm and dry.  Neurological:     General: No focal deficit present.     Mental Status: She is alert and oriented to person, place, and time.     Comments: No facial droop or weakness, nonfocal neurologic exam.  No pronator drift.  Equal grip strength.  Ambulating without difficulty, negative  Romberg.  Psychiatric:        Mood and Affect: Mood normal.        Behavior: Behavior normal.     Assessment & Plan:  [name] is a 35 y.o. female . Essential hypertension, benign - Plan: Basic metabolic panel, TSH  Nonintractable episodic headache, unspecified headache type  Borderline hypertension but improved on current dosing of losartan.  With reported intermittent lightheadedness hesitant to change meds at this time but if persistent elevated readings would consider adding amlodipine low-dose.  Check creatinine, TSH to evaluate for possible secondary  hypertension and consider hypertension clinic eval.  May need further studies including renal artery scan.  Has follow-up planned with headache specialist.  Headaches may be related to hypertension.  Previous imaging reassuring.  Nonfocal neurologic exam.  Update next few days with home readings.  Follow-up in 2 weeks.  ER/RTC precautions.  No orders of the defined types were placed in this encounter.  Patient Instructions  Blood pressure is better. No change for now, stay on losartan 100mg , but if readings remain above 140/90 into next week let me know. We may add low dose of additional medication. Send me an update by Monday on weekend readings.  Recheck in 2 weeks. Labs at [facility] tomorrow if possible.   Return to the clinic or go to the nearest emergency room if any of your symptoms worsen or new symptoms occur.  General Headache Without Cause A headache is pain or discomfort felt around the head or neck area. There are many causes and types of headaches. A few common types include: Tension headaches. Migraine headaches. Cluster headaches. Chronic daily headaches. Sometimes, the specific cause of a headache may not be found. Follow these instructions at home: Watch your condition for any changes. Let your health care provider know about them. Take these steps to help with your condition: Managing pain   Take  over-the-counter and prescription medicines only as told by your health care provider. Treatment may include medicines for pain that are taken by mouth or applied to the skin. Lie down in a dark, quiet room when you have a headache. Keep lights dim if bright lights bother you or make your headaches worse. If directed, put ice on your head and neck area: Put ice in a plastic bag. Place a towel between your skin and the bag. Leave the ice on for 20 minutes, 2-3 times per day. Remove the ice if your skin turns bright red. This is very important. If you cannot feel pain, heat, or cold, you have a greater risk of damage to the area. If directed, apply heat to the affected area. Use the heat source that your health care provider recommends, such as a moist heat pack or a heating pad. Place a towel between your skin and the heat source. Leave the heat on for 20-30 minutes. Remove the heat if your skin turns bright red. This is especially important if you are unable to feel pain, heat, or cold. You have a greater risk of getting burned. Eating and drinking Eat meals on a regular schedule. If you drink alcohol: Limit how much you have to: 0-1 drink a day for women who are not pregnant. 0-2 drinks a day for men. Know how much alcohol is in a drink. In the [place] one drink equals one 12 oz bottle of beer (355 mL), one 5 oz glass of wine (148 mL), or one 1 oz glass of hard liquor (44 mL). Stop drinking caffeine, or decrease the amount of caffeine you drink. Drink enough fluid to keep your urine pale yellow. General instructions  Keep a headache journal to help find out what may trigger your headaches. For example, write down: What you eat and drink. How much sleep you get. Any change to your diet or medicines. Try massage or other relaxation techniques. Limit stress. Sit up straight, and do not tense your muscles. Do not use any products that contain nicotine or tobacco. These products include  cigarettes, chewing tobacco, and vaping devices, such as e-cigarettes. If you need  help quitting, ask your health care provider. Exercise regularly as told by your health care provider. Sleep on a regular schedule. Get 7-9 hours of sleep each night, or the amount recommended by your health care provider. Keep all follow-up visits. This is important. Contact a health care provider if: Medicine does not help your symptoms. You have a headache that is different from your usual headache. You have nausea or you vomit. You have a fever. Get help right away if: Your headache: Becomes severe quickly. Gets worse after moderate to intense physical activity. You have any of these symptoms: Repeated vomiting. Pain or stiffness in your neck. Changes to your vision. Pain in an eye or ear. Problems with speech. Muscular weakness or loss of muscle control. Loss of balance or coordination. You feel faint or pass out. You have confusion. You have a seizure. These symptoms may represent a serious problem that is an emergency. Do not wait to see if the symptoms will go away. Get medical help right away. Call your local emergency services (911 in the [place] Do not drive yourself to the hospital. Summary A headache is pain or discomfort felt around the head or neck area. There are many causes and types of headaches. In some cases, the cause may not be found. Keep a headache journal to help find out what may trigger your headaches. Watch your condition for any changes. Let your health care provider know about them. Contact a health care provider if you have a headache that is different from the usual headache, or if your symptoms are not helped by medicine. Get help right away if your headache becomes severe, you vomit, you have a loss of vision, you lose your balance, or you have a seizure. This information is not intended to replace advice given to you by your health care provider. Make sure you discuss  any questions you have with your health care provider. Document Revised: 11/08/2020 Document Reviewed: 11/08/2020 Elsevier Patient Education  2022 [place] Your Hypertension Hypertension, also called high blood pressure, is when the force of the blood pressing against the walls of the arteries is too strong. Arteries are blood vessels that carry blood from your heart throughout your body. Hypertension forces the heart to work harder to pump blood and may cause the arteries to become narrow or stiff. Understanding blood pressure readings Your personal target blood pressure may vary depending on your medical conditions, your age, and other factors. A blood pressure reading includes a higher number over a lower number. Ideally, your blood pressure should be below 120/80. You should know that: The first, or top, number is called the systolic pressure. It is a measure of the pressure in your arteries as your heart beats. The second, or bottom number, is called the diastolic pressure. It is a measure of the pressure in your arteries as the heart relaxes. Blood pressure is classified into four stages. Based on your blood pressure reading, your health care provider may use the following stages to determine what type of treatment you need, if any. Systolic pressure and diastolic pressure are measured in a unit called mmHg. Normal Systolic pressure: below 123456. Diastolic pressure: below 80. Elevated Systolic pressure: [id number]. Diastolic pressure: below 80. Hypertension stage 1 Systolic pressure: 0000000. Diastolic pressure: [id number]. Hypertension stage 2 Systolic pressure: XX123456 or above. Diastolic pressure: 90 or above. How can this condition affect me? Managing your hypertension is an important responsibility. Over time, hypertension can damage the arteries  and decrease blood flow to important parts of the body, including the brain, heart, and kidneys. Having untreated or uncontrolled  hypertension can lead to: A heart attack. A stroke. A weakened blood vessel (aneurysm). Heart failure. Kidney damage. Eye damage. Metabolic syndrome. Memory and concentration problems. Vascular dementia. What actions can I take to manage this condition? Hypertension can be managed by making lifestyle changes and possibly by taking medicines. Your health care provider will help you make a plan to bring your blood pressure within a normal range. Nutrition  Eat a diet that is high in fiber and potassium, and low in salt (sodium), added sugar, and fat. An example eating plan is called the Dietary Approaches to Stop Hypertension (DASH) diet. To eat this way: Eat plenty of fresh fruits and vegetables. Try to fill one-half of your plate at each meal with fruits and vegetables. Eat whole grains, such as whole-wheat pasta, brown rice, or whole-grain bread. Fill about one-fourth of your plate with whole grains. Eat low-fat dairy products. Avoid fatty cuts of meat, processed or cured meats, and poultry with skin. Fill about one-fourth of your plate with lean proteins such as fish, chicken without skin, beans, eggs, and tofu. Avoid pre-made and processed foods. These tend to be higher in sodium, added sugar, and fat. Reduce your daily sodium intake. Most people with hypertension should eat less than 1,500 mg of sodium a day. Lifestyle  Work with your health care provider to maintain a healthy body weight or to lose weight. Ask what an ideal weight is for you. Get at least 30 minutes of exercise that causes your heart to beat faster (aerobic exercise) most days of the week. Activities may include walking, swimming, or biking. Include exercise to strengthen your muscles (resistance exercise), such as weight lifting, as part of your weekly exercise routine. Try to do these types of exercises for 30 minutes at least 3 days a week. Do not use any products that contain nicotine or tobacco, such as  cigarettes, e-cigarettes, and chewing tobacco. If you need help quitting, ask your health care provider. Control any long-term (chronic) conditions you have, such as high cholesterol or diabetes. Identify your sources of stress and find ways to manage stress. This may include meditation, deep breathing, or making time for fun activities. Alcohol use Do not drink alcohol if: Your health care provider tells you not to drink. You are pregnant, may be pregnant, or are planning to become pregnant. If you drink alcohol: Limit how much you use to: 0-1 drink a day for women. 0-2 drinks a day for men. Be aware of how much alcohol is in your drink. In the [place] one drink equals one 12 oz bottle of beer (355 mL), one 5 oz glass of wine (148 mL), or one 1 oz glass of hard liquor (44 mL). Medicines Your health care provider may prescribe medicine if lifestyle changes are not enough to get your blood pressure under control and if: Your systolic blood pressure is 130 or higher. Your diastolic blood pressure is 80 or higher. Take medicines only as told by your health care provider. Follow the directions carefully. Blood pressure medicines must be taken as told by your health care provider. The medicine does not work as well when you skip doses. Skipping doses also puts you at risk for problems. Monitoring Before you monitor your blood pressure: Do not smoke, drink caffeinated beverages, or exercise within 30 minutes before taking a measurement. Use the bathroom and empty  your bladder (urinate). Sit quietly for at least 5 minutes before taking measurements. Monitor your blood pressure at home as told by your health care provider. To do this: Sit with your back straight and supported. Place your feet flat on the floor. Do not cross your legs. Support your arm on a flat surface, such as a table. Make sure your upper arm is at heart level. Each time you measure, take two or three readings one minute apart and  record the results. You may also need to have your blood pressure checked regularly by your health care provider. General information Talk with your health care provider about your diet, exercise habits, and other lifestyle factors that may be contributing to hypertension. Review all the medicines you take with your health care provider because there may be side effects or interactions. Keep all visits as told by your health care provider. Your health care provider can help you create and adjust your plan for managing your high blood pressure. Where to find more information National Heart, Lung, and Blood Institute: https://wilson-eaton.com/ American Heart Association: www.heart.org Contact a health care provider if: You think you are having a reaction to medicines you have taken. You have repeated (recurrent) headaches. You feel dizzy. You have swelling in your ankles. You have trouble with your vision. Get help right away if: You develop a severe headache or confusion. You have unusual weakness or numbness, or you feel faint. You have severe pain in your chest or abdomen. You vomit repeatedly. You have trouble breathing. These symptoms may represent a serious problem that is an emergency. Do not wait to see if the symptoms will go away. Get medical help right away. Call your local emergency services (911 in the [place] Do not drive yourself to the hospital. Summary Hypertension is when the force of blood pumping through your arteries is too strong. If this condition is not controlled, it may put you at risk for serious complications. Your personal target blood pressure may vary depending on your medical conditions, your age, and other factors. For most people, a normal blood pressure is less than 120/80. Hypertension is managed by lifestyle changes, medicines, or both. Lifestyle changes to help manage hypertension include losing weight, eating a healthy, low-sodium diet, exercising more, stopping  smoking, and limiting alcohol. This information is not intended to replace advice given to you by your health care provider. Make sure you discuss any questions you have with your health care provider. Document Revised: 06/28/2019 Document Reviewed: 05/11/2019 Elsevier Patient Education  2022 [place],   Merri Ray, MD [place], [place] [facility] 06/21/21 5:19 PM

## 2021-06-21 NOTE — Patient Instructions (Addendum)
Blood pressure is better. No change for now, stay on losartan 100mg , but if readings remain above 140/90 into next week let me know. We may add low dose of additional medication. Send me an update by Monday on weekend readings.  Recheck in 2 weeks. Labs at [facility] tomorrow if possible.   Return to the clinic or go to the nearest emergency room if any of your symptoms worsen or new symptoms occur.  General Headache Without Cause A headache is pain or discomfort felt around the head or neck area. There are many causes and types of headaches. A few common types include: Tension headaches. Migraine headaches. Cluster headaches. Chronic daily headaches. Sometimes, the specific cause of a headache may not be found. Follow these instructions at home: Watch your condition for any changes. Let your health care provider know about them. Take these steps to help with your condition: Managing pain   Take over-the-counter and prescription medicines only as told by your health care provider. Treatment may include medicines for pain that are taken by mouth or applied to the skin. Lie down in a dark, quiet room when you have a headache. Keep lights dim if bright lights bother you or make your headaches worse. If directed, put ice on your head and neck area: Put ice in a plastic bag. Place a towel between your skin and the bag. Leave the ice on for 20 minutes, 2-3 times per day. Remove the ice if your skin turns bright red. This is very important. If you cannot feel pain, heat, or cold, you have a greater risk of damage to the area. If directed, apply heat to the affected area. Use the heat source that your health care provider recommends, such as a moist heat pack or a heating pad. Place a towel between your skin and the heat source. Leave the heat on for 20-30 minutes. Remove the heat if your skin turns bright red. This is especially important if you are unable to feel pain, heat, or cold. You  have a greater risk of getting burned. Eating and drinking Eat meals on a regular schedule. If you drink alcohol: Limit how much you have to: 0-1 drink a day for women who are not pregnant. 0-2 drinks a day for men. Know how much alcohol is in a drink. In the [place] one drink equals one 12 oz bottle of beer (355 mL), one 5 oz glass of wine (148 mL), or one 1 oz glass of hard liquor (44 mL). Stop drinking caffeine, or decrease the amount of caffeine you drink. Drink enough fluid to keep your urine pale yellow. General instructions  Keep a headache journal to help find out what may trigger your headaches. For example, write down: What you eat and drink. How much sleep you get. Any change to your diet or medicines. Try massage or other relaxation techniques. Limit stress. Sit up straight, and do not tense your muscles. Do not use any products that contain nicotine or tobacco. These products include cigarettes, chewing tobacco, and vaping devices, such as e-cigarettes. If you need help quitting, ask your health care provider. Exercise regularly as told by your health care provider. Sleep on a regular schedule. Get 7-9 hours of sleep each night, or the amount recommended by your health care provider. Keep all follow-up visits. This is important. Contact a health care provider if: Medicine does not help your symptoms. You have a headache that is different from your usual headache. You have nausea  or you vomit. You have a fever. Get help right away if: Your headache: Becomes severe quickly. Gets worse after moderate to intense physical activity. You have any of these symptoms: Repeated vomiting. Pain or stiffness in your neck. Changes to your vision. Pain in an eye or ear. Problems with speech. Muscular weakness or loss of muscle control. Loss of balance or coordination. You feel faint or pass out. You have confusion. You have a seizure. These symptoms may represent a serious  problem that is an emergency. Do not wait to see if the symptoms will go away. Get medical help right away. Call your local emergency services (911 in the [place] Do not drive yourself to the hospital. Summary A headache is pain or discomfort felt around the head or neck area. There are many causes and types of headaches. In some cases, the cause may not be found. Keep a headache journal to help find out what may trigger your headaches. Watch your condition for any changes. Let your health care provider know about them. Contact a health care provider if you have a headache that is different from the usual headache, or if your symptoms are not helped by medicine. Get help right away if your headache becomes severe, you vomit, you have a loss of vision, you lose your balance, or you have a seizure. This information is not intended to replace advice given to you by your health care provider. Make sure you discuss any questions you have with your health care provider. Document Revised: 11/08/2020 Document Reviewed: 11/08/2020 Elsevier Patient Education  2022 [employer]   Managing Your Hypertension Hypertension, also called high blood pressure, is when the force of the blood pressing against the walls of the arteries is too strong. Arteries are blood vessels that carry blood from your heart throughout your body. Hypertension forces the heart to work harder to pump blood and may cause the arteries to become narrow or stiff. Understanding blood pressure readings Your personal target blood pressure may vary depending on your medical conditions, your age, and other factors. A blood pressure reading includes a higher number over a lower number. Ideally, your blood pressure should be below 120/80. You should know that: The first, or top, number is called the systolic pressure. It is a measure of the pressure in your arteries as your heart beats. The second, or bottom number, is called the diastolic  pressure. It is a measure of the pressure in your arteries as the heart relaxes. Blood pressure is classified into four stages. Based on your blood pressure reading, your health care provider may use the following stages to determine what type of treatment you need, if any. Systolic pressure and diastolic pressure are measured in a unit called mmHg. Normal Systolic pressure: below 120. Diastolic pressure: below 80. Elevated Systolic pressure: 120-129. Diastolic pressure: below 80. Hypertension stage 1 Systolic pressure: 130-139. Diastolic pressure: 80-89. Hypertension stage 2 Systolic pressure: 140 or above. Diastolic pressure: 90 or above. How can this condition affect me? Managing your hypertension is an important responsibility. Over time, hypertension can damage the arteries and decrease blood flow to important parts of the body, including the brain, heart, and kidneys. Having untreated or uncontrolled hypertension can lead to: A heart attack. A stroke. A weakened blood vessel (aneurysm). Heart failure. Kidney damage. Eye damage. Metabolic syndrome. Memory and concentration problems. Vascular dementia. What actions can I take to manage this condition? Hypertension can be managed by making lifestyle changes and possibly by taking  medicines. Your health care provider will help you make a plan to bring your blood pressure within a normal range. Nutrition  Eat a diet that is high in fiber and potassium, and low in salt (sodium), added sugar, and fat. An example eating plan is called the Dietary Approaches to Stop Hypertension (DASH) diet. To eat this way: Eat plenty of fresh fruits and vegetables. Try to fill one-half of your plate at each meal with fruits and vegetables. Eat whole grains, such as whole-wheat pasta, brown rice, or whole-grain bread. Fill about one-fourth of your plate with whole grains. Eat low-fat dairy products. Avoid fatty cuts of meat, processed or cured meats,  and poultry with skin. Fill about one-fourth of your plate with lean proteins such as fish, chicken without skin, beans, eggs, and tofu. Avoid pre-made and processed foods. These tend to be higher in sodium, added sugar, and fat. Reduce your daily sodium intake. Most people with hypertension should eat less than 1,500 mg of sodium a day. Lifestyle  Work with your health care provider to maintain a healthy body weight or to lose weight. Ask what an ideal weight is for you. Get at least 30 minutes of exercise that causes your heart to beat faster (aerobic exercise) most days of the week. Activities may include walking, swimming, or biking. Include exercise to strengthen your muscles (resistance exercise), such as weight lifting, as part of your weekly exercise routine. Try to do these types of exercises for 30 minutes at least 3 days a week. Do not use any products that contain nicotine or tobacco, such as cigarettes, e-cigarettes, and chewing tobacco. If you need help quitting, ask your health care provider. Control any long-term (chronic) conditions you have, such as high cholesterol or diabetes. Identify your sources of stress and find ways to manage stress. This may include meditation, deep breathing, or making time for fun activities. Alcohol use Do not drink alcohol if: Your health care provider tells you not to drink. You are pregnant, may be pregnant, or are planning to become pregnant. If you drink alcohol: Limit how much you use to: 0-1 drink a day for women. 0-2 drinks a day for men. Be aware of how much alcohol is in your drink. In the [place] one drink equals one 12 oz bottle of beer (355 mL), one 5 oz glass of wine (148 mL), or one 1 oz glass of hard liquor (44 mL). Medicines Your health care provider may prescribe medicine if lifestyle changes are not enough to get your blood pressure under control and if: Your systolic blood pressure is 130 or higher. Your diastolic blood pressure  is 80 or higher. Take medicines only as told by your health care provider. Follow the directions carefully. Blood pressure medicines must be taken as told by your health care provider. The medicine does not work as well when you skip doses. Skipping doses also puts you at risk for problems. Monitoring Before you monitor your blood pressure: Do not smoke, drink caffeinated beverages, or exercise within 30 minutes before taking a measurement. Use the bathroom and empty your bladder (urinate). Sit quietly for at least 5 minutes before taking measurements. Monitor your blood pressure at home as told by your health care provider. To do this: Sit with your back straight and supported. Place your feet flat on the floor. Do not cross your legs. Support your arm on a flat surface, such as a table. Make sure your upper arm is at heart level. Each  time you measure, take two or three readings one minute apart and record the results. You may also need to have your blood pressure checked regularly by your health care provider. General information Talk with your health care provider about your diet, exercise habits, and other lifestyle factors that may be contributing to hypertension. Review all the medicines you take with your health care provider because there may be side effects or interactions. Keep all visits as told by your health care provider. Your health care provider can help you create and adjust your plan for managing your high blood pressure. Where to find more information National Heart, Lung, and Blood Institute: [employer]is American Heart Association: www.heart.org Contact a health care provider if: You think you are having a reaction to medicines you have taken. You have repeated (recurrent) headaches. You feel dizzy. You have swelling in your ankles. You have trouble with your vision. Get help right away if: You develop a severe headache or confusion. You have unusual weakness or  numbness, or you feel faint. You have severe pain in your chest or abdomen. You vomit repeatedly. You have trouble breathing. These symptoms may represent a serious problem that is an emergency. Do not wait to see if the symptoms will go away. Get medical help right away. Call your local emergency services (911 in the [place] Do not drive yourself to the hospital. Summary Hypertension is when the force of blood pumping through your arteries is too strong. If this condition is not controlled, it may put you at risk for serious complications. Your personal target blood pressure may vary depending on your medical conditions, your age, and other factors. For most people, a normal blood pressure is less than 120/80. Hypertension is managed by lifestyle changes, medicines, or both. Lifestyle changes to help manage hypertension include losing weight, eating a healthy, low-sodium diet, exercising more, stopping smoking, and limiting alcohol. This information is not intended to replace advice given to you by your health care provider. Make sure you discuss any questions you have with your health care provider. Document Revised: 06/28/2019 Document Reviewed: 05/11/2019 Elsevier Patient Education  2022 [employer].

## 2021-06-23 ENCOUNTER — Encounter: Payer: Self-pay | Admitting: Family Medicine

## 2021-08-02 ENCOUNTER — Telehealth (INDEPENDENT_AMBULATORY_CARE_PROVIDER_SITE_OTHER): Payer: 59 | Admitting: Family Medicine

## 2021-08-02 ENCOUNTER — Telehealth: Payer: Self-pay | Admitting: *Deleted

## 2021-08-02 VITALS — BP 195/89

## 2021-08-02 DIAGNOSIS — I10 Essential (primary) hypertension: Secondary | ICD-10-CM | POA: Diagnosis not present

## 2021-08-02 DIAGNOSIS — R519 Headache, unspecified: Secondary | ICD-10-CM | POA: Diagnosis not present

## 2021-08-02 MED ORDER — LOSARTAN POTASSIUM-HCTZ 100-12.5 MG PO TABS: 1.0000 | ORAL_TABLET | Freq: Every day | ORAL | 1 refills | Status: DC

## 2021-08-02 MED ORDER — LOSARTAN POTASSIUM 100 MG PO TABS: 100.0000 mg | ORAL_TABLET | Freq: Every day | ORAL | 2 refills | Status: DC

## 2021-08-02 NOTE — Telephone Encounter (Signed)
I called patient and left voicemail letting her know that rx was sent to pharmacy and that we would see her this afternoon for her virtual visit.

## 2021-08-02 NOTE — Telephone Encounter (Signed)
Patient called and states she has run out of medication. A new prescription for Losartan 100mg  was not sent in to the pharmacy last time she was in to see Dr. . BP had lowered with 100mg .   Her BP was 195/129 this morning and her pulse was 71. She is not able to come in office as her father recently passes and is making funeral arrangements but will be on a virtual this pm. She would like medication sent in prior to the appt as she is believes her blood pressure is higher due to her father passing and is completely out of medication (Losartan).   She is not feeling well. She has a horrible migraine and is nauseas. Headache specialists have not been able to get her sooner.

## 2021-08-02 NOTE — Progress Notes (Signed)
Virtual Visit via Video Note  I connected with [name] on 08/02/21 at 4:56 PM by a video enabled telemedicine application and verified that I am speaking with the correct person using two identifiers.  Patient location: in car, by self.  My location: office - [place]   I discussed the limitations, risks, security and privacy concerns of performing an evaluation and management service by telephone and the availability of in person appointments. I also discussed with the patient that there may be a patient responsible charge related to this service. The patient expressed understanding and agreed to proceed, consent obtained  Chief complaint:  Chief Complaint  Patient presents with   Hypertension    Pt reports this am 195/89, pt reports father recently had passed and has had some higher numbers, has had some headaches, nausea thi am, has been trying to hydrate, needs a new Rx that states 2 tablets daily for losartan     History of Present Illness: [name] is a 36 y.o. female  Hypertension: Last visit 1229, 1 week follow-up at that time.  Blood pressure had improved on losartan 50, then 100 mg for the prior 5 to 6 days. Plan for update on home readings next few days then 2-week follow-up.  Did ultimately continue 100 mg, blood pressure was under better control on that level.  Headaches with some fatigue, stressors were discussed at last visit, improved with Excedrin Migraine.  Plan follow-up with headache specialist.  Previous imaging was reassuring and nonfocal neurologic exam at that time.  Unfortunately her stepfather passed away yesterday. Had some heart problems, fell at work. Cardiac arrest.  Family present. Having to handle his arrangements. Staying busy. Denies needs at this time.  Has not been able to return recurrent viral illness and kids illness.  Recently run out of medications blood pressure 195/89 this morning.  Has had some high readings recently.lat took meds  6 days ago.  Home readings on losartan 150-155/90's.  Headache this morning. No chest pain, no focal weakness, some blurry vision this am, normal now, nausea this am. No vomiting.  Felt like prior migraine. 195/129 this morning. Garlic apple cider vinegar and 2 CBD gummies. Feeling better.  Current BP 166/109. Pulse 82.    BP Readings from Last 3 Encounters:  08/02/21 (!) 195/89  06/21/21 140/88  06/14/21 (!) 140/105   Lab Results  Component Value Date   CREATININE 0.69 06/09/2021      Patient Active Problem List   Diagnosis Date Noted   Normal pregnancy 01/07/2013   SVD (spontaneous vaginal delivery) 08/20/2011   Gestational hypertension 08/19/2011   CONSTIPATION 03/06/2009   PAIN IN JOINT, LOWER LEG 03/06/2009   FATIGUE 03/06/2009   WEIGHT GAIN, ABNORMAL 03/06/2009   ABDOMINAL PAIN OTHER SPECIFIED SITE 03/28/2008   Headache 03/23/2008   MIGRAINE HEADACHE 06/29/2007   HYPERTENSION, BENIGN ESSENTIAL 06/29/2007   UTI'S, RECURRENT 06/29/2007   ACNE, MILD 06/26/2007   Past Medical History:  Diagnosis Date   Abdominal hernia    Anxiety    Pregnancy induced hypertension    Past Surgical History:  Procedure Laterality Date   NO PAST SURGERIES     Allergies  Allergen Reactions   Latex Hives, Itching and Rash   Prior to Admission medications   Medication Sig Start Date End Date Taking? Authorizing Provider  ibuprofen (ADVIL) 800 MG tablet Take 1 tablet (800 mg total) by mouth every 8 (eight) hours as needed for moderate pain. 06/05/19  Yes Mullis,  Kiersten P, DO  losartan (COZAAR) 100 MG tablet Take 1 tablet (100 mg total) by mouth daily. 08/02/21  Yes Shade Flood, MD  Naproxen Sodium (ALEVE PO) Take 2 tablets by mouth daily as needed (for pain).   Yes [provider]  omeprazole (PRILOSEC) 20 MG capsule Take 1 capsule (20 mg total) by mouth daily. 04/08/16  Yes Shade Flood, MD  Prenatal Vit-Fe Fumarate-FA (PRENATAL MULTIVITAMIN) TABS Take 1 tablet by  mouth daily at 12 noon.   Yes [provider]  sucralfate (CARAFATE) 1 GM/10ML suspension Take 10 mLs (1 g total) by mouth 4 (four) times daily -  with meals and at bedtime. 04/08/16  Yes Shade Flood, MD   Social History   Socioeconomic History   Marital status: Single    Spouse name: Not on file   Number of children: Not on file   Years of education: Not on file   Highest education level: Not on file  Occupational History   Not on file  Tobacco Use   Smoking status: Never   Smokeless tobacco: Never  Substance and Sexual Activity   Alcohol use: No   Drug use: No   Sexual activity: Yes    Birth control/protection: None    Comment: Just stopped breast feeding  Other Topics Concern   Not on file  Social History Narrative   Not on file   Social Determinants of Health   Financial Resource Strain: Not on file  Food Insecurity: Not on file  Transportation Needs: Not on file  Physical Activity: Not on file  Stress: Not on file  Social Connections: Not on file  Intimate Partner Violence: Not on file    Observations/Objective: Vitals:   08/02/21 1632  BP: (!) 195/89  BP 166/109 during virtual visit.  No acute distress on video, nontoxic appearance.  Speaking in full sentences without respiratory distress.  Intelligible speech, no facial droop or weakness noted.  Appropriate responses, coherent responses, all questions were answered with understanding of plan expressed.   Assessment and Plan: Uncontrolled hypertension - Plan: losartan-hydrochlorothiazide (HYZAAR) 100-12.5 MG tablet  Acute nonintractable headache, unspecified headache type - Plan: losartan-hydrochlorothiazide (HYZAAR) 100-12.5 MG tablet Condolences given on the passing of her stepfather.  Denies acute needs at this time but option of counseling or other resources if needed.  She will let us know.  Uncontrolled hypertension off meds.  Headache and blurry vision this morning but that has improved,  no focal weakness at this time.  Home readings were still elevated on losartan 100 mg daily.  Will change to losartan HCT 100/12.5 mg.  Potential side effects discussed.  Nurse visit tomorrow for labs that were ordered in December and blood pressure check.  In person visit with me in 1 week to reassess hypertension.  ER/911 precautions given if acute or worsening symptoms with elevated blood pressure.  Understanding expressed   Follow Up Instructions:  Nurse visit tomorrow, in office 1 week with me.   I discussed the assessment and treatment plan with the patient. The patient was provided an opportunity to ask questions and all were answered. The patient agreed with the plan and demonstrated an understanding of the instructions.   The patient was advised to call back or seek an in-person evaluation if the symptoms worsen or if the condition fails to improve as anticipated.  I provided 32 minutes of non-face-to-face time during this encounter.  Shade Flood, MD

## 2021-08-02 NOTE — Telephone Encounter (Signed)
Last note reviewed from December 29.  Was having some headaches at that time, continued on losartan 100 mg with 2-week follow-up planned and option of higher dose meds if needed.  Blood pressure 140/88 at that time.  100 mg losartan sent to pharmacy now.  I am very sorry to hear about her father.  Happy to evaluate her this afternoon, but I am concerned about the migraine and nausea.  If she is having new or worsening headaches or new symptoms with her headache at that blood pressure should be seen through the ER.

## 2022-01-11 ENCOUNTER — Other Ambulatory Visit: Payer: Self-pay | Admitting: Family Medicine

## 2022-01-11 DIAGNOSIS — I10 Essential (primary) hypertension: Secondary | ICD-10-CM

## 2022-01-11 DIAGNOSIS — R519 Headache, unspecified: Secondary | ICD-10-CM

## 2022-02-16 ENCOUNTER — Other Ambulatory Visit: Payer: Self-pay | Admitting: Family Medicine

## 2022-02-16 DIAGNOSIS — R519 Headache, unspecified: Secondary | ICD-10-CM

## 2022-02-16 DIAGNOSIS — I10 Essential (primary) hypertension: Secondary | ICD-10-CM

## 2024-04-18 ENCOUNTER — Emergency Department (HOSPITAL_COMMUNITY)

## 2024-04-18 ENCOUNTER — Emergency Department (HOSPITAL_COMMUNITY)
Admission: EM | Admit: 2024-04-18 | Discharge: 2024-04-18 | Disposition: A | Discharge status: Home / Self Care | Attending: Emergency Medicine | Admitting: Emergency Medicine

## 2024-04-18 ENCOUNTER — Other Ambulatory Visit: Payer: Self-pay

## 2024-04-18 ENCOUNTER — Encounter (HOSPITAL_COMMUNITY): Payer: Self-pay | Admitting: Pharmacy Technician

## 2024-04-18 DIAGNOSIS — Z79899 Other long term (current) drug therapy: Secondary | ICD-10-CM | POA: Insufficient documentation

## 2024-04-18 DIAGNOSIS — I10 Essential (primary) hypertension: Secondary | ICD-10-CM | POA: Insufficient documentation

## 2024-04-18 DIAGNOSIS — R519 Headache, unspecified: Secondary | ICD-10-CM | POA: Diagnosis present

## 2024-04-18 LAB — BASIC METABOLIC PANEL WITH GFR
Anion gap: 7 (ref 5–15)
BUN: 6 mg/dL (ref 6–20)
CO2: 24 mmol/L (ref 22–32)
Calcium: 9.1 mg/dL (ref 8.9–10.3)
Chloride: 108 mmol/L (ref 98–111)
Creatinine, Ser: 0.68 mg/dL (ref 0.44–1.00)
GFR, Estimated: >60 mL/min (ref >60)
Glucose, Bld: 95 mg/dL (ref 70–99)
Potassium: 4 mmol/L (ref 3.5–5.1)
Sodium: 139 mmol/L (ref 135–145)

## 2024-04-18 LAB — CBC
HCT: 35.8 % — ABNORMAL LOW (ref 36.0–46.0)
Hemoglobin: 12.2 g/dL (ref 12.0–15.0)
MCH: 27.2 pg (ref 26.0–34.0)
MCHC: 34.1 g/dL (ref 30.0–36.0)
MCV: 79.7 fL — ABNORMAL LOW (ref 80.0–100.0)
Platelets: 343 K/uL (ref 150–400)
RBC: 4.49 MIL/uL (ref 3.87–5.11)
RDW: 14.1 % (ref 11.5–15.5)
WBC: 5.1 K/uL (ref 4.0–10.5)
nRBC: 0 % (ref 0.0–0.2)

## 2024-04-18 LAB — HCG, SERUM, QUALITATIVE: Preg, Serum: NEGATIVE

## 2024-04-18 MED ORDER — HYDROCHLOROTHIAZIDE 12.5 MG PO TABS
12.5000 mg | ORAL_TABLET | Freq: Every day | ORAL | Status: DC
  Administered 2024-04-18: 12.5 mg via ORAL
  Filled 2024-04-18: qty 1

## 2024-04-18 MED ORDER — HYDROCHLOROTHIAZIDE 12.5 MG PO TABS: 12.5000 mg | ORAL_TABLET | Freq: Every day | ORAL | 1 refills | Status: DC

## 2024-04-18 MED ORDER — KETOROLAC TROMETHAMINE 15 MG/ML IJ SOLN
15.0000 mg | Freq: Once | INTRAMUSCULAR | Status: AC
  Administered 2024-04-18: 15 mg via INTRAMUSCULAR
  Filled 2024-04-18: qty 1

## 2024-04-18 NOTE — ED Provider Notes (Signed)
  EMERGENCY DEPARTMENT AT [facility] Provider Note   CSN: [id number] Arrival date & time: 04/18/24  1142     Patient presents with: Hypertension   [name] is a 38 y.o. female patient with history of hypertension not currently on any antihypertensives who presents to the emergency department today for further evaluation of elevated blood pressure and headache.  She states that she has been getting headaches 3-4 times per week which is pretty indicative of elevated blood pressure for her.  She states that she has not been on any antihypertensives for the last year as she has not been able to find a primary care doctor.  She has lost approximately 40 pounds and cleaned up her diet.  She states that her antihypertensive medication in the past were causing hypotension after she lost weight.  Patient denies any chest pain, shortness of breath, focal weakness, focal numbness.  Headache is currently improving.  She denies any visual disturbances.   Hypertension       Prior to Admission medications   Medication Sig Start Date End Date Taking? Authorizing Provider  hydrochlorothiazide (HYDRODIURIL) 12.5 MG tablet Take 1 tablet (12.5 mg total) by mouth daily. 04/18/24  Yes Theotis, Izella Ybanez M, PA-C  ibuprofen  (ADVIL ) 800 MG tablet Take 1 tablet (800 mg total) by mouth every 8 (eight) hours as needed for moderate pain. 06/05/19   Mullis, Kiersten P, DO  losartan -hydrochlorothiazide (HYZAAR) 100-12.5 MG tablet TAKE 1 TABLET BY MOUTH DAILY 01/14/22   Levora Reyes SAUNDERS, MD  Naproxen Sodium (ALEVE PO) Take 2 tablets by mouth daily as needed (for pain).    [provider]  omeprazole  (PRILOSEC) 20 MG capsule Take 1 capsule (20 mg total) by mouth daily. 04/08/16   Levora Reyes SAUNDERS, MD  Prenatal Vit-Fe Fumarate-FA (PRENATAL MULTIVITAMIN) TABS Take 1 tablet by mouth daily at 12 noon.    [provider]  sucralfate  (CARAFATE ) 1 GM/10ML suspension Take 10 mLs (1 g  total) by mouth 4 (four) times daily -  with meals and at bedtime. 04/08/16   Levora Reyes SAUNDERS, MD    Allergies: Latex    Review of Systems  All other systems reviewed and are negative.   Updated Vital Signs BP (!) 182/121 (BP Location: Right Arm)   Pulse 82   Temp 98.3 F (36.8 C) (Oral)   Resp 16   SpO2 100%   Physical Exam Vitals and nursing note reviewed.  Constitutional:      General: She is not in acute distress.    Appearance: Normal appearance.  HENT:     Head: Normocephalic and atraumatic.  Eyes:     General:        Right eye: No discharge.        Left eye: No discharge.  Cardiovascular:     Comments: Regular rate and rhythm.  S1/S2 are distinct without any evidence of murmur, rubs, or gallops.  Radial pulses are 2+ bilaterally.  Dorsalis pedis pulses are 2+ bilaterally.  No evidence of pedal edema. Pulmonary:     Comments: Clear to auscultation bilaterally.  Normal effort.  No respiratory distress.  No evidence of wheezes, rales, or rhonchi heard throughout. Abdominal:     General: Abdomen is flat. Bowel sounds are normal. There is no distension.     Tenderness: There is no abdominal tenderness. There is no guarding or rebound.  Musculoskeletal:        General: Normal range of motion.  Cervical back: Neck supple.  Skin:    General: Skin is warm and dry.     Findings: No rash.  Neurological:     General: No focal deficit present.     Mental Status: She is alert.     Comments: Cranial nerves II through XII are intact.  5/5 strength to the upper and lower extremities.  Normal sensation to the upper and lower extremities.  Extraocular movements are intact without any evidence of nystagmus.  Alert and orient x 3.  Psychiatric:        Mood and Affect: Mood normal.        Behavior: Behavior normal.     (all labs ordered are listed, but only abnormal results are displayed) Labs Reviewed  CBC - Abnormal; Notable for the following components:      Result  Value   HCT 35.8 (*)    MCV 79.7 (*)    All other components within normal limits  BASIC METABOLIC PANEL WITH GFR  HCG, SERUM, QUALITATIVE    EKG: None  Radiology: CT Head Wo Contrast Result Date: 04/18/2024 CLINICAL DATA:  Sudden onset of severe headache. EXAM: CT HEAD WITHOUT CONTRAST TECHNIQUE: Contiguous axial images were obtained from the base of the skull through the vertex without intravenous contrast. RADIATION DOSE REDUCTION: This exam was performed according to the departmental dose-optimization program which includes automated exposure control, adjustment of the mA and/or kV according to patient size and/or use of iterative reconstruction technique. COMPARISON:  06/09/2021 FINDINGS: Brain: No intracranial hemorrhage, mass effect, or midline shift. No hydrocephalus. The basilar cisterns are patent. No evidence of territorial infarct or acute ischemia. No extra-axial or intracranial fluid collection. Vascular: No hyperdense vessel or unexpected calcification. Skull: No fracture or focal lesion. Sinuses/Orbits: No acute findings. Chronic mucosal thickening in the right frontal sinus. No progressive sinus disease. No mastoid effusion. Other: None. IMPRESSION: No acute intracranial abnormality. Electronically Signed   By: Andrea Gasman M.D.   On: 04/18/2024 14:08     Procedures   Medications Ordered in the ED  hydrochlorothiazide (HYDRODIURIL) tablet 12.5 mg (12.5 mg Oral Given 04/18/24 1406)  ketorolac  (TORADOL ) 15 MG/ML injection 15 mg (15 mg Intramuscular Given 04/18/24 1406)    Clinical Course as of 04/18/24 1443  Sun Apr 18, 2024  1440 On repeat evaluation, patient's blood pressure continues to decrease.  It is down about 30-ish points since intake vitals.  Patient feeling better from a headache perspective after Toradol .  She decision-making was done whether to continue to observe the patient or discharge.  Patient wants to go home which I think is totally reasonable.  I  will give her a prescription for hydrochlorothiazide and primary care follow-up. [CF]  1441 hCG, serum, qualitative Negative. [CF]  1441 CBC(!) Negative. [CF]  1441 Basic metabolic panel Negative. [CF]    Clinical Course User Index [CF] Theotis Cameron HERO, PA-C    Medical Decision Making MELEANE SELINGER is a 38 y.o. female patient who presents to the emergency department today for further evaluation of elevated blood pressure.  Patient does meet criteria for hypertensive urgency or emergency.  She does have a slight headache which is likely due to to elevated blood pressure.  She has been on Hyzaar 100-12.5 in the past.  Will start her on hydrochlorothiazide 12.5 for now and plan to reassess I will also give her some Toradol  for the headache.  CT scan was ordered in triage for headache.  All of her labs are  reassuring at this time.  No other vital sign abnormalities.  CT scan negative.  Labs are all reassuring.  Low suspicion for stroke.  Low suspicion for hypertensive emergency at this time.  This is likely just hypertensive urgency.  Patient has not been taking any medications.  Will prescribe her hydrochlorothiazide.  She will follow-up with primary care doctor.  Strict turn precautions were discussed.  She is safe for discharge at this time.  Amount and/or Complexity of Data Reviewed Labs:  Decision-making details documented in ED Course. Radiology: ordered.  Risk Prescription drug management.    Final diagnoses:  Hypertension, unspecified type    ED Discharge Orders          Ordered    hydrochlorothiazide (HYDRODIURIL) 12.5 MG tablet  Daily        04/18/24 1441               Theotis Peers [place], [place] 04/18/24 1443    Laurice Maude BROCKS, MD 04/19/24 [phone]

## 2024-04-18 NOTE — ED Triage Notes (Signed)
 Pt here with concern of hypertension and migraines. States having 3-4 migraines per week.

## 2024-04-18 NOTE — ED Provider Triage Note (Signed)
 Emergency Medicine Provider Triage Evaluation Note  [name] , a 38 y.o. female  was evaluated in triage.  Pt complains of hypertension.  Patient is concerned because she notes that her migraines have been becoming more frequent, occurring 3-4 times per week.  She notes 2 nights ago her migraine kept me up all night, she does not have a migraine currently.  She endorses a history of hypertension, was previously prescribed hydrochlorothiazide but has been off of this for at least a year and a half.  She has been checking her blood pressure at home and notes that it has been elevated, as high as 210's systolic at times.  Denies chest pain, shortness of breath, abdominal pain, nausea/vomiting, lower extremity edema.  She has been hospitalized previously for hypertension years ago.  Review of Systems  Positive: As above Negative: As above  Physical Exam  BP (!) 204/110 (BP Location: Right Arm)   Pulse 79   Temp 98.3 F (36.8 C) (Oral)   Resp 16   SpO2 100%  Gen:   Awake, no distress   Resp:  Normal effort  MSK:   Moves extremities without difficulty  Other:    Medical Decision Making  Medically screening exam initiated at 12:13 PM.  Appropriate orders placed.  [name] was informed that the remainder of the evaluation will be completed by another provider, this initial triage assessment does not replace that evaluation, and the importance of remaining in the ED until their evaluation is complete.     [name], [place] 04/18/24 1215

## 2024-04-18 NOTE — Discharge Instructions (Signed)
 As we discussed, please call and schedule an appointment with the [facility] health community health and wellness.  I have sent a prescription for hydrochlorothiazide.  Please take as prescribed at night like we talked about.  Keep your blood pressure log.  Return to the emergency department for any worsening symptoms.

## 2024-04-19 ENCOUNTER — Encounter: Payer: Self-pay | Admitting: Family Medicine

## 2024-04-19 ENCOUNTER — Ambulatory Visit: Payer: Self-pay

## 2024-04-19 ENCOUNTER — Ambulatory Visit (INDEPENDENT_AMBULATORY_CARE_PROVIDER_SITE_OTHER): Admitting: Family Medicine

## 2024-04-19 VITALS — BP 140/96 | HR 88 | Temp 99.0°F | Ht 63.5 in | Wt 177.2 lb

## 2024-04-19 DIAGNOSIS — R519 Headache, unspecified: Secondary | ICD-10-CM

## 2024-04-19 DIAGNOSIS — R29898 Other symptoms and signs involving the musculoskeletal system: Secondary | ICD-10-CM

## 2024-04-19 DIAGNOSIS — I10 Essential (primary) hypertension: Secondary | ICD-10-CM | POA: Diagnosis not present

## 2024-04-19 NOTE — Progress Notes (Unsigned)
 Multiple  Subjective:  Patient ID: [name], female    DOB: [birth year]  Age: 38 y.o. MRN: [id number]  CC:  Chief Complaint  Patient presents with   Medical Management of Chronic Issues    Pt went to ED yesterday; reports readings of 206/119 and 195/119. Patient endorses a headache but no chest pain, SOB, weakness or blurred vision.    HPI XZARIA TEO presents for   Uncontrolled hypertension Last visit with me in February 2023.  Had previously been seen in December, blood pressure had improved on losartan  50 mg then 100 mg.  Unfortunately had a death in the family where her stepfather had recently passed away.  Had run out of medications and blood pressure elevated off meds.  Did note at that time pressure between 150-155 over 90s on the 100 mg losartan .  I increased her medication to losartan  HCT combo 100/12.5 mg daily, and in person visit in 1 week recommended to reassess hypertension.  Unfortunately have not seen her since that time. Some difficulty taking off time at work. No other barriers to care. Able to follow up for future visits at this time.   She was seen in the ER yesterday with headache and elevated blood pressure.  Headache 3-4 times per week per notes from the ER.  Had not seen PCP and therefore had been off meds.  Did note in ER note that she had lost approximately 40 pounds with dietary changes and previous medication had caused her blood pressure to drop too low.  Headache was improving in the ER visit, no chest pain dyspnea focal weakness or focal numbness and no visual disturbances.  She was discharged on HCTZ 12.5 mg daily.  Took one dose hydrochlorothiazide  yesterday at ER. Has not picked up Rx yet today.   Weight improvements noted below, 19 pound weight loss since December 2022.   FH of HTN - mom, dad. No FH of early CAD.  HA better today. Had bloodwork in R arm. R hand feels weak for past 3 days. Bad HA at that time - migraine. Nausea, no vomiting.  Some light sensitivity.  Hard to grip gas pump, picking up purse  No current HA.  No slurred speech.  CT head yesterday - no acute intracranial abnormality.   Wt Readings from Last 3 Encounters:  04/19/24 177 lb 3.2 oz (80.4 kg)  06/21/21 196 lb (88.9 kg)  06/14/21 194 lb 6.4 oz (88.2 kg)   BP Readings from Last 3 Encounters:  04/19/24 (!) 140/96  04/18/24 (!) 182/121  08/02/21 (!) 195/89      History Patient Active Problem List   Diagnosis Date Noted   Normal pregnancy 01/07/2013   SVD (spontaneous vaginal delivery) 08/20/2011   Gestational hypertension 08/19/2011   CONSTIPATION 03/06/2009   PAIN IN JOINT, LOWER LEG 03/06/2009   FATIGUE 03/06/2009   WEIGHT GAIN, ABNORMAL 03/06/2009   ABDOMINAL PAIN OTHER SPECIFIED SITE 03/28/2008   Headache 03/23/2008   MIGRAINE HEADACHE 06/29/2007   HYPERTENSION, BENIGN ESSENTIAL 06/29/2007   UTI'S, RECURRENT 06/29/2007   ACNE, MILD 06/26/2007   Past Medical History:  Diagnosis Date   Abdominal hernia    Anxiety    Pregnancy induced hypertension    Past Surgical History:  Procedure Laterality Date   NO PAST SURGERIES     Allergies  Allergen Reactions   Latex Hives, Itching and Rash   Prior to Admission medications   Medication Sig Start Date End Date Taking? Authorizing Provider  ibuprofen  (ADVIL ) 800 MG tablet Take 1 tablet (800 mg total) by mouth every 8 (eight) hours as needed for moderate pain. 06/05/19  Yes Mullis, Kiersten P, DO  losartan -hydrochlorothiazide (HYZAAR) 100-12.5 MG tablet TAKE 1 TABLET BY MOUTH DAILY 01/14/22  Yes Levora Reyes SAUNDERS, MD  Naproxen Sodium (ALEVE PO) Take 2 tablets by mouth daily as needed (for pain).   Yes [provider]  omeprazole  (PRILOSEC) 20 MG capsule Take 1 capsule (20 mg total) by mouth daily. 04/08/16  Yes Levora Reyes SAUNDERS, MD  Prenatal Vit-Fe Fumarate-FA (PRENATAL MULTIVITAMIN) TABS Take 1 tablet by mouth daily at 12 noon.   Yes [provider]  sucralfate   (CARAFATE ) 1 GM/10ML suspension Take 10 mLs (1 g total) by mouth 4 (four) times daily -  with meals and at bedtime. 04/08/16  Yes Levora Reyes SAUNDERS, MD  hydrochlorothiazide (HYDRODIURIL) 12.5 MG tablet Take 1 tablet (12.5 mg total) by mouth daily. Patient not taking: Reported on 04/19/2024 04/18/24   Theotis Cameron HERO, PA-C   Social History   Socioeconomic History   Marital status: Single    Spouse name: Not on file   Number of children: Not on file   Years of education: Not on file   Highest education level: Not on file  Occupational History   Not on file  Tobacco Use   Smoking status: Never   Smokeless tobacco: Never  Substance and Sexual Activity   Alcohol use: No   Drug use: No   Sexual activity: Yes    Birth control/protection: None    Comment: Just stopped breast feeding  Other Topics Concern   Not on file  Social History Narrative   Not on file   Social Drivers of Health   Financial Resource Strain: Not on file  Food Insecurity: Not on file  Transportation Needs: Not on file  Physical Activity: Not on file  Stress: Not on file  Social Connections: Not on file  Intimate Partner Violence: Not on file    Review of Systems Per HPI.   Objective:   Vitals:   04/19/24 1501 04/19/24 1513  BP: (!) 160/100 (!) 140/96  Pulse: 88   Temp: 99 F (37.2 C)   TempSrc: Oral   SpO2: 100%   Weight: 177 lb 3.2 oz (80.4 kg)   Height: 5' 3.5 (1.613 m)      Physical Exam Vitals reviewed.  Constitutional:      Appearance: Normal appearance. She is well-developed.  HENT:     Head: Normocephalic and atraumatic.  Eyes:     Conjunctiva/sclera: Conjunctivae normal.     Pupils: Pupils are equal, round, and reactive to light.  Neck:     Vascular: No carotid bruit.  Cardiovascular:     Rate and Rhythm: Normal rate and regular rhythm.     Heart sounds: Normal heart sounds.  Pulmonary:     Effort: Pulmonary effort is normal.     Breath sounds: Normal breath sounds.   Abdominal:     Palpations: Abdomen is soft. There is no pulsatile mass.     Tenderness: There is no abdominal tenderness.  Musculoskeletal:     Right lower leg: No edema.     Left lower leg: No edema.  Skin:    General: Skin is warm and dry.  Neurological:     Mental Status: She is alert and oriented to person, place, and time.     GCS: GCS eye subscore is 4. GCS verbal subscore is 5. GCS  motor subscore is 6.     Cranial Nerves: No dysarthria or facial asymmetry.     Motor: Weakness (4/5 with R grip (dominant hand) vs left 5/5.) present. No tremor or pronator drift.     Coordination: Coordination is intact. Romberg sign negative. Coordination normal. Finger-Nose-Finger Test and Heel to [facility] Test normal. Rapid alternating movements normal.     Gait: Gait is intact.  Psychiatric:        Mood and Affect: Mood normal.        Behavior: Behavior normal.     Assessment & Plan:  DORALEE KOCAK is a 38 y.o. female . Right hand weakness - Plan: MR Brain Wo Contrast  Hypertension, uncontrolled - Plan: MR Brain Wo Contrast  Nonintractable headache, unspecified chronicity pattern, unspecified headache type - Plan: MR Brain Wo Contrast Uncontrolled hypertension.  Recent ER visit including neuroimaging and CT reviewed, but with slight right hand weakness, MRI brain ordered to rule out CVA or subtle findings not seen on initial CT.  This was negative for acute findings, some findings indicated likely sequela from migraine headaches which she has experienced previously.  HCTZ was ordered by ER and will start with that initially for treatment, home monitoring and close follow-up with ER precautions  No orders of the defined types were placed in this encounter.  Patient Instructions  We have scheduled an MRI for tomorrow at 2:30 PM at [facility].  Please arrive at 2:00 PM for that study.  Once I see the results I will let you know.     [facility]     [address] [place], [place] [address]  [phone]   Okay to continue hydrochlorothiazide once per day for now.  We may need to add medication but I would like to see how that medicine works first.  Follow-up with me in 1 week to recheck blood pressure, sooner if readings are increasing.  Please let us  know if there are any other questions. Return to the clinic or go to the nearest emergency room if any of your symptoms worsen or new symptoms occur.  Managing Your Hypertension Hypertension, also called high blood pressure, is when the force of the blood pressing against the walls of the arteries is too strong. Arteries are blood vessels that carry blood from your heart throughout your body. Hypertension forces the heart to work harder to pump blood and may cause the arteries to become narrow or stiff. Understanding blood pressure readings A blood pressure reading includes a higher number over a lower number: The first, or top, number is called the systolic pressure. It is a measure of the pressure in your arteries as your heart beats. The second, or bottom number, is called the diastolic pressure. It is a measure of the pressure in your arteries as the heart relaxes. For most people, a normal blood pressure is below 120/80. Your personal target blood pressure may vary depending on your medical conditions, your age, and other factors. Blood pressure is classified into four stages. Based on your blood pressure reading, your health care provider may use the following stages to determine what type of treatment you need, if any. Systolic pressure and diastolic pressure are measured in a unit called millimeters of mercury (mmHg). Normal Systolic pressure: below 120. Diastolic pressure: below 80. Elevated Systolic pressure: 120-129. Diastolic pressure: below 80. Hypertension stage 1 Systolic pressure: 130-139. Diastolic pressure: 80-89. Hypertension stage 2 Systolic pressure: 140 or  above. Diastolic pressure: 90 or above. How can this condition affect me? Managing your hypertension is very important. Over time, hypertension can damage the arteries and decrease blood flow to parts of the body, including the brain, heart, and kidneys. Having untreated or uncontrolled hypertension can lead to: A heart attack. A stroke. A weakened blood vessel (aneurysm). Heart failure. Kidney damage. Eye damage. Memory and concentration problems. Vascular dementia. What actions can I take to manage this condition? Hypertension can be managed by making lifestyle changes and possibly by taking medicines. Your health care provider will help you make a plan to bring your blood pressure within a normal range. You may be referred for counseling on a healthy diet and physical activity. Nutrition  Eat a diet that is high in fiber and potassium, and low in salt (sodium), added sugar, and fat. An example eating plan is called the DASH diet. DASH stands for Dietary Approaches to Stop Hypertension. To eat this way: Eat plenty of fresh fruits and vegetables. Try to fill one-half of your plate at each meal with fruits and vegetables. Eat whole grains, such as whole-wheat pasta, brown rice, or whole-grain bread. Fill about one-fourth of your plate with whole grains. Eat low-fat dairy products. Avoid fatty cuts of meat, processed or cured meats, and poultry with skin. Fill about one-fourth of your plate with lean proteins such as fish, chicken without skin, beans, eggs, and tofu. Avoid pre-made and processed foods. These tend to be higher in sodium, added sugar, and fat. Reduce your daily sodium intake. Many people with hypertension should eat less than 1,500 mg of sodium a day. Lifestyle  Work with your health care provider to maintain a healthy body weight or to lose weight. Ask what an ideal weight is for you. Get at least 30 minutes of exercise that causes your heart to beat faster (aerobic exercise)  most days of the week. Activities may include walking, swimming, or biking. Include exercise to strengthen your muscles (resistance exercise), such as weight lifting, as part of your weekly exercise routine. Try to do these types of exercises for 30 minutes at least 3 days a week. Do not use any products that contain nicotine or tobacco. These products include cigarettes, chewing tobacco, and vaping devices, such as e-cigarettes. If you need help quitting, ask your health care provider. Control any long-term (chronic) conditions you have, such as high cholesterol or diabetes. Identify your sources of stress and find ways to manage stress. This may include meditation, deep breathing, or making time for fun activities. Alcohol use Do not drink alcohol if: Your health care provider tells you not to drink. You are pregnant, may be pregnant, or are planning to become pregnant. If you drink alcohol: Limit how much you have to: 0-1 drink a day for women. 0-2 drinks a day for men. Know how much alcohol is in your drink. In the [place] one drink equals one 12 oz bottle of beer (355 mL), one 5 oz glass of wine (148 mL), or one 1 oz glass of hard liquor (44 mL). Medicines Your health care provider may prescribe medicine if lifestyle changes are not enough to get your blood pressure under control and if: Your systolic blood pressure is 130 or higher. Your diastolic blood pressure is 80 or higher. Take medicines only as told by your health care provider. Follow the directions carefully. Blood pressure medicines must be taken as told by your health care provider. The medicine does not work as well  when you skip doses. Skipping doses also puts you at risk for problems. Monitoring Before you monitor your blood pressure: Do not smoke, drink caffeinated beverages, or exercise within 30 minutes before taking a measurement. Use the bathroom and empty your bladder (urinate). Sit quietly for at least 5 minutes  before taking measurements. Monitor your blood pressure at home as told by your health care provider. To do this: Sit with your back straight and supported. Place your feet flat on the floor. Do not cross your legs. Support your arm on a flat surface, such as a table. Make sure your upper arm is at heart level. Each time you measure, take two or three readings one minute apart and record the results. You may also need to have your blood pressure checked regularly by your health care provider. General information Talk with your health care provider about your diet, exercise habits, and other lifestyle factors that may be contributing to hypertension. Review all the medicines you take with your health care provider because there may be side effects or interactions. Keep all follow-up visits. Your health care provider can help you create and adjust your plan for managing your high blood pressure. Where to find more information National Heart, Lung, and Blood Institute: popsteam.is American Heart Association: www.heart.org Contact a health care provider if: You think you are having a reaction to medicines you have taken. You have repeated (recurrent) headaches. You feel dizzy. You have swelling in your ankles. You have trouble with your vision. Get help right away if: You develop a severe headache or confusion. You have unusual weakness or numbness, or you feel faint. You have severe pain in your chest or abdomen. You vomit repeatedly. You have trouble breathing. These symptoms may be an emergency. Get help right away. Call 911. Do not wait to see if the symptoms will go away. Do not drive yourself to the hospital. Summary Hypertension is when the force of blood pumping through your arteries is too strong. If this condition is not controlled, it may put you at risk for serious complications. Your personal target blood pressure may vary depending on your medical conditions, your age,  and other factors. For most people, a normal blood pressure is less than 120/80. Hypertension is managed by lifestyle changes, medicines, or both. Lifestyle changes to help manage hypertension include losing weight, eating a healthy, low-sodium diet, exercising more, stopping smoking, and limiting alcohol. This information is not intended to replace advice given to you by your health care provider. Make sure you discuss any questions you have with your health care provider. Document Revised: 02/22/2021 Document Reviewed: 02/22/2021 Elsevier Patient Education  2024 [employer].    Signed,   Reyes Pines, MD [facility], [facility] 04/19/24 3:48 PM

## 2024-04-19 NOTE — Patient Instructions (Addendum)
 We have scheduled an MRI for tomorrow at 2:30 PM at [facility].  Please arrive at 2:00 PM for that study.  Once I see the results I will let you know.     [facility]     [address] [place], [place] [address]  [phone]   Okay to continue hydrochlorothiazide once per day for now.  We may need to add medication but I would like to see how that medicine works first.  Follow-up with me in 1 week to recheck blood pressure, sooner if readings are increasing.  Please let us  know if there are any other questions. Return to the clinic or go to the nearest emergency room if any of your symptoms worsen or new symptoms occur.  Managing Your Hypertension Hypertension, also called high blood pressure, is when the force of the blood pressing against the walls of the arteries is too strong. Arteries are blood vessels that carry blood from your heart throughout your body. Hypertension forces the heart to work harder to pump blood and may cause the arteries to become narrow or stiff. Understanding blood pressure readings A blood pressure reading includes a higher number over a lower number: The first, or top, number is called the systolic pressure. It is a measure of the pressure in your arteries as your heart beats. The second, or bottom number, is called the diastolic pressure. It is a measure of the pressure in your arteries as the heart relaxes. For most people, a normal blood pressure is below 120/80. Your personal target blood pressure may vary depending on your medical conditions, your age, and other factors. Blood pressure is classified into four stages. Based on your blood pressure reading, your health care provider may use the following stages to determine what type of treatment you need, if any. Systolic pressure and diastolic pressure are measured in a unit called millimeters of mercury (mmHg). Normal Systolic pressure: below 120. Diastolic pressure: below  80. Elevated Systolic pressure: 120-129. Diastolic pressure: below 80. Hypertension stage 1 Systolic pressure: 130-139. Diastolic pressure: 80-89. Hypertension stage 2 Systolic pressure: 140 or above. Diastolic pressure: 90 or above. How can this condition affect me? Managing your hypertension is very important. Over time, hypertension can damage the arteries and decrease blood flow to parts of the body, including the brain, heart, and kidneys. Having untreated or uncontrolled hypertension can lead to: A heart attack. A stroke. A weakened blood vessel (aneurysm). Heart failure. Kidney damage. Eye damage. Memory and concentration problems. Vascular dementia. What actions can I take to manage this condition? Hypertension can be managed by making lifestyle changes and possibly by taking medicines. Your health care provider will help you make a plan to bring your blood pressure within a normal range. You may be referred for counseling on a healthy diet and physical activity. Nutrition  Eat a diet that is high in fiber and potassium, and low in salt (sodium), added sugar, and fat. An example eating plan is called the DASH diet. DASH stands for Dietary Approaches to Stop Hypertension. To eat this way: Eat plenty of fresh fruits and vegetables. Try to fill one-half of your plate at each meal with fruits and vegetables. Eat whole grains, such as whole-wheat pasta, brown rice, or whole-grain bread. Fill about one-fourth of your plate with whole grains. Eat low-fat dairy products. Avoid fatty cuts of meat, processed or cured meats, and poultry with skin. Fill about one-fourth of your plate with lean proteins  such as fish, chicken without skin, beans, eggs, and tofu. Avoid pre-made and processed foods. These tend to be higher in sodium, added sugar, and fat. Reduce your daily sodium intake. Many people with hypertension should eat less than 1,500 mg of sodium a day. Lifestyle  Work with your  health care provider to maintain a healthy body weight or to lose weight. Ask what an ideal weight is for you. Get at least 30 minutes of exercise that causes your heart to beat faster (aerobic exercise) most days of the week. Activities may include walking, swimming, or biking. Include exercise to strengthen your muscles (resistance exercise), such as weight lifting, as part of your weekly exercise routine. Try to do these types of exercises for 30 minutes at least 3 days a week. Do not use any products that contain nicotine or tobacco. These products include cigarettes, chewing tobacco, and vaping devices, such as e-cigarettes. If you need help quitting, ask your health care provider. Control any long-term (chronic) conditions you have, such as high cholesterol or diabetes. Identify your sources of stress and find ways to manage stress. This may include meditation, deep breathing, or making time for fun activities. Alcohol use Do not drink alcohol if: Your health care provider tells you not to drink. You are pregnant, may be pregnant, or are planning to become pregnant. If you drink alcohol: Limit how much you have to: 0-1 drink a day for women. 0-2 drinks a day for men. Know how much alcohol is in your drink. In the [place] one drink equals one 12 oz bottle of beer (355 mL), one 5 oz glass of wine (148 mL), or one 1 oz glass of hard liquor (44 mL). Medicines Your health care provider may prescribe medicine if lifestyle changes are not enough to get your blood pressure under control and if: Your systolic blood pressure is 130 or higher. Your diastolic blood pressure is 80 or higher. Take medicines only as told by your health care provider. Follow the directions carefully. Blood pressure medicines must be taken as told by your health care provider. The medicine does not work as well when you skip doses. Skipping doses also puts you at risk for problems. Monitoring Before you monitor your blood  pressure: Do not smoke, drink caffeinated beverages, or exercise within 30 minutes before taking a measurement. Use the bathroom and empty your bladder (urinate). Sit quietly for at least 5 minutes before taking measurements. Monitor your blood pressure at home as told by your health care provider. To do this: Sit with your back straight and supported. Place your feet flat on the floor. Do not cross your legs. Support your arm on a flat surface, such as a table. Make sure your upper arm is at heart level. Each time you measure, take two or three readings one minute apart and record the results. You may also need to have your blood pressure checked regularly by your health care provider. General information Talk with your health care provider about your diet, exercise habits, and other lifestyle factors that may be contributing to hypertension. Review all the medicines you take with your health care provider because there may be side effects or interactions. Keep all follow-up visits. Your health care provider can help you create and adjust your plan for managing your high blood pressure. Where to find more information National Heart, Lung, and Blood Institute: popsteam.is American Heart Association: www.heart.org Contact a health care provider if: You think you are having a reaction  to medicines you have taken. You have repeated (recurrent) headaches. You feel dizzy. You have swelling in your ankles. You have trouble with your vision. Get help right away if: You develop a severe headache or confusion. You have unusual weakness or numbness, or you feel faint. You have severe pain in your chest or abdomen. You vomit repeatedly. You have trouble breathing. These symptoms may be an emergency. Get help right away. Call 911. Do not wait to see if the symptoms will go away. Do not drive yourself to the hospital. Summary Hypertension is when the force of blood pumping through your  arteries is too strong. If this condition is not controlled, it may put you at risk for serious complications. Your personal target blood pressure may vary depending on your medical conditions, your age, and other factors. For most people, a normal blood pressure is less than 120/80. Hypertension is managed by lifestyle changes, medicines, or both. Lifestyle changes to help manage hypertension include losing weight, eating a healthy, low-sodium diet, exercising more, stopping smoking, and limiting alcohol. This information is not intended to replace advice given to you by your health care provider. Make sure you discuss any questions you have with your health care provider. Document Revised: 02/22/2021 Document Reviewed: 02/22/2021 Elsevier Patient Education  2024 [employer].

## 2024-04-19 NOTE — Telephone Encounter (Signed)
 FYI patient has appt with you today at 2:40pm

## 2024-04-19 NOTE — Telephone Encounter (Addendum)
 Patient called with concerns for high blood pressure readings. Patient reports readings of 206/119 and 195/119. Patient endorses a headache but no CP, SOB, weakness or blurred vision. Patient is still an established patient with [facility] per CAL. Scheduled for acute visit today at 2:40 PM at [facility]. Patient verbalized understanding.   FYI Only or Action Required?: FYI only for provider.  Patient was last seen in primary care on 06/21/2021 by Levora Reyes SAUNDERS, MD.  Called Nurse Triage reporting Hypertension.  Symptoms began several weeks ago.  Interventions attempted: Prescription medications: HYDROCHLOROTHIAZIDE and Rest, hydration, or home remedies.  Symptoms are: unchanged.  Triage Disposition: See HCP Within 4 Hours (Or PCP Triage)  Patient/caregiver understands and will follow disposition?:   Copied from CRM #[id number]. Topic: Clinical - Red Word Triage >> Apr 19, 2024  9:06 AM Rosaria BRAVO wrote: Red Word that prompted transfer to Nurse Triage: Bp 195/119 Reason for Disposition  [1] Systolic BP >= 200 OR Diastolic >= 120 AND [2] having NO cardiac or neurologic symptoms  Answer Assessment - Initial Assessment Questions 1. BLOOD PRESSURE: What is your blood pressure? Did you take at least two measurements 5 minutes apart?      206/119   195/119 2. ONSET: When did you take your blood pressure?     This AM 3. HOW: How did you take your blood pressure? (e.g., automatic home BP monitor, visiting nurse)     Automatic home BP monitor 4. HISTORY: Do you have a history of high blood pressure?     yes 5. MEDICINES: Are you taking any medicines for blood pressure? Have you missed any doses recently?     Hydrochlorothiazide-was given medication yesterday. 6. OTHER SYMPTOMS: Do you have any symptoms? (e.g., blurred vision, chest pain, difficulty breathing, headache, weakness)     Headache 7. PREGNANCY: Is there any chance you are pregnant? When was your last  menstrual period?     no  Protocols used: Blood Pressure - High-A-AH

## 2024-04-20 ENCOUNTER — Ambulatory Visit (HOSPITAL_COMMUNITY)
Admission: RE | Admit: 2024-04-20 | Discharge: 2024-04-20 | Disposition: A | Discharge status: Home / Self Care | Source: Ambulatory Visit | Attending: Family Medicine | Admitting: Family Medicine

## 2024-04-20 DIAGNOSIS — R29898 Other symptoms and signs involving the musculoskeletal system: Secondary | ICD-10-CM | POA: Insufficient documentation

## 2024-04-20 DIAGNOSIS — R519 Headache, unspecified: Secondary | ICD-10-CM | POA: Insufficient documentation

## 2024-04-20 DIAGNOSIS — I10 Essential (primary) hypertension: Secondary | ICD-10-CM | POA: Insufficient documentation

## 2024-04-20 MED ORDER — HYDROCHLOROTHIAZIDE 12.5 MG PO TABS: 12.5000 mg | ORAL_TABLET | Freq: Every day | ORAL | 1 refills | Status: AC

## 2024-04-21 ENCOUNTER — Ambulatory Visit: Payer: Self-pay | Admitting: Family Medicine

## 2024-04-22 ENCOUNTER — Telehealth: Payer: Self-pay

## 2024-04-22 DIAGNOSIS — Z0279 Encounter for issue of other medical certificate: Secondary | ICD-10-CM

## 2024-04-22 NOTE — Telephone Encounter (Signed)
 Type of form received: FMLA requested by [facility]   Additional comments:   Received by: Fax  Form should be Faxed/mailed to: [phone]  Is patient requesting call for pickup: no  Form placed:  in providers folder at front desk   Attach charge sheet.  Provider will determine charge.  Individual made aware of 3-5 business day turn around No?

## 2024-04-22 NOTE — Telephone Encounter (Signed)
 No appt is scheduled for paperwork to be completed. Please advise if appt is needed. Paperwork is in PCP folder

## 2024-04-23 NOTE — Telephone Encounter (Signed)
 I will complete paperwork. Will need to know more info to complete. I tried to call patient. Mailbox is full. Will reach out to her again in next few days.

## 2024-04-23 NOTE — Telephone Encounter (Signed)
 Called patient and left vm to see what [employer] papers are form. Patient may need appointment. Waiting for Dr. Levora to let us  know.

## 2024-04-26 NOTE — Telephone Encounter (Signed)
 Called patient and unable to lvmtrc

## 2024-04-26 NOTE — Progress Notes (Signed)
 Also called to make the appointment but no answer and VM was full

## 2024-04-28 DIAGNOSIS — Z0279 Encounter for issue of other medical certificate: Secondary | ICD-10-CM

## 2024-04-28 NOTE — Telephone Encounter (Addendum)
 Called patient and unable to reach, unable to leave voicemail.  I will complete paperwork based on her office visit with me, ER visit, and anticipated time out moving forward but if this needs to be addended, please have her let us  know.  Paperwork completed and placed in fax bin at back nurse station.

## 2024-04-28 NOTE — Telephone Encounter (Signed)
 Called patient and unable to leave vm to return call

## 2024-04-29 NOTE — Telephone Encounter (Signed)
 Paperwork is in barista in front office.

## 2024-05-06 ENCOUNTER — Encounter: Payer: Self-pay | Admitting: Family Medicine

## 2024-05-06 NOTE — Progress Notes (Signed)
 Called again to schedule appt, unable to LVM, letter out
